# Patient Record
Sex: Male | Born: 1955
Health system: Southern US, Community
[De-identification: ages and names within clinical notes are randomized; demographics above are authoritative.]

## PROBLEM LIST (undated history)

## (undated) DIAGNOSIS — I10 Essential (primary) hypertension: Secondary | ICD-10-CM

## (undated) HISTORY — PX: TONSILLECTOMY: SUR1361

## (undated) HISTORY — PX: HERNIA REPAIR: SHX51

---

## 2018-06-09 DIAGNOSIS — C801 Malignant (primary) neoplasm, unspecified: Secondary | ICD-10-CM

## 2018-06-09 HISTORY — PX: OTHER SURGICAL HISTORY: SHX169

## 2018-06-09 HISTORY — DX: Malignant (primary) neoplasm, unspecified: C80.1

## 2018-09-16 DIAGNOSIS — D0339 Melanoma in situ of other parts of face: Secondary | ICD-10-CM | POA: Diagnosis not present

## 2018-11-05 DIAGNOSIS — E7800 Pure hypercholesterolemia, unspecified: Secondary | ICD-10-CM | POA: Diagnosis not present

## 2018-11-05 DIAGNOSIS — I10 Essential (primary) hypertension: Secondary | ICD-10-CM | POA: Diagnosis not present

## 2018-11-09 DIAGNOSIS — Z1331 Encounter for screening for depression: Secondary | ICD-10-CM | POA: Diagnosis not present

## 2018-11-09 DIAGNOSIS — N529 Male erectile dysfunction, unspecified: Secondary | ICD-10-CM | POA: Diagnosis not present

## 2018-11-09 DIAGNOSIS — G4700 Insomnia, unspecified: Secondary | ICD-10-CM | POA: Diagnosis not present

## 2018-11-09 DIAGNOSIS — K219 Gastro-esophageal reflux disease without esophagitis: Secondary | ICD-10-CM | POA: Diagnosis not present

## 2018-11-09 DIAGNOSIS — I10 Essential (primary) hypertension: Secondary | ICD-10-CM | POA: Diagnosis not present

## 2019-03-08 DIAGNOSIS — Z23 Encounter for immunization: Secondary | ICD-10-CM | POA: Diagnosis not present

## 2019-03-08 DIAGNOSIS — Z6833 Body mass index (BMI) 33.0-33.9, adult: Secondary | ICD-10-CM | POA: Diagnosis not present

## 2019-03-08 DIAGNOSIS — M25551 Pain in right hip: Secondary | ICD-10-CM | POA: Diagnosis not present

## 2019-03-09 DIAGNOSIS — M25551 Pain in right hip: Secondary | ICD-10-CM | POA: Diagnosis not present

## 2019-03-09 DIAGNOSIS — M1611 Unilateral primary osteoarthritis, right hip: Secondary | ICD-10-CM | POA: Diagnosis not present

## 2019-04-05 DIAGNOSIS — M1611 Unilateral primary osteoarthritis, right hip: Secondary | ICD-10-CM | POA: Diagnosis not present

## 2019-04-15 DIAGNOSIS — M1611 Unilateral primary osteoarthritis, right hip: Secondary | ICD-10-CM | POA: Diagnosis not present

## 2019-04-15 DIAGNOSIS — M1610 Unilateral primary osteoarthritis, unspecified hip: Secondary | ICD-10-CM | POA: Diagnosis not present

## 2019-05-11 DIAGNOSIS — K219 Gastro-esophageal reflux disease without esophagitis: Secondary | ICD-10-CM | POA: Diagnosis not present

## 2019-05-11 DIAGNOSIS — Z125 Encounter for screening for malignant neoplasm of prostate: Secondary | ICD-10-CM | POA: Diagnosis not present

## 2019-05-11 DIAGNOSIS — I10 Essential (primary) hypertension: Secondary | ICD-10-CM | POA: Diagnosis not present

## 2019-05-11 DIAGNOSIS — E7800 Pure hypercholesterolemia, unspecified: Secondary | ICD-10-CM | POA: Diagnosis not present

## 2019-05-13 DIAGNOSIS — M25551 Pain in right hip: Secondary | ICD-10-CM | POA: Diagnosis not present

## 2019-05-13 DIAGNOSIS — K219 Gastro-esophageal reflux disease without esophagitis: Secondary | ICD-10-CM | POA: Diagnosis not present

## 2019-05-13 DIAGNOSIS — I10 Essential (primary) hypertension: Secondary | ICD-10-CM | POA: Diagnosis not present

## 2019-05-13 DIAGNOSIS — G4700 Insomnia, unspecified: Secondary | ICD-10-CM | POA: Diagnosis not present

## 2019-06-22 DIAGNOSIS — M1611 Unilateral primary osteoarthritis, right hip: Secondary | ICD-10-CM | POA: Diagnosis not present

## 2019-06-22 DIAGNOSIS — M25551 Pain in right hip: Secondary | ICD-10-CM | POA: Diagnosis not present

## 2019-06-22 DIAGNOSIS — Z6833 Body mass index (BMI) 33.0-33.9, adult: Secondary | ICD-10-CM | POA: Diagnosis not present

## 2019-07-01 ENCOUNTER — Encounter (HOSPITAL_COMMUNITY): Payer: Self-pay | Admitting: Orthopedic Surgery

## 2019-07-01 DIAGNOSIS — M25551 Pain in right hip: Secondary | ICD-10-CM | POA: Diagnosis not present

## 2019-07-01 DIAGNOSIS — S72021A Displaced fracture of epiphysis (separation) (upper) of right femur, initial encounter for closed fracture: Secondary | ICD-10-CM | POA: Diagnosis not present

## 2019-07-01 NOTE — H&P (Signed)
TOTAL HIP ADMISSION H&P  Patient is admitted for right total hip arthroplasty.  Subjective:  Chief Complaint: Right displaced femoral neck fracture  HPI: John Bartlett, 64 y.o. male, has a history of pain and functional disability in right hip due to known bone-on-bone osteoarthritis. Has failed conservative measures in the past including activity modification and intra-articular cortisone injection. Patient presented to the office on 07/01/2019, stating that he had a significant increase in pain without known injury to the right hip. Described the pain as "unbearable" and was requiring a walker for ambulation. X-rays obtained in the office showed end-stage severe osteoarthritis of the right hip with erosion of the femoral head, as well as a displaced right femoral neck fracture.  There are no problems to display for this patient.  History reviewed. No pertinent past medical history.  History reviewed. No pertinent surgical history.  No current facility-administered medications for this encounter.   No current outpatient medications on file.   Not on File  Social History   Tobacco Use  . Smoking status: Not on file  Substance Use Topics  . Alcohol use: Not on file    History reviewed. No pertinent family history.   Review of Systems  Constitutional: Negative for chills and fever.  HENT: Negative for congestion, sore throat and tinnitus.   Eyes: Negative for photophobia and pain.  Respiratory: Negative for cough, shortness of breath and wheezing.   Cardiovascular: Negative for chest pain and palpitations.  Gastrointestinal: Negative for nausea and vomiting.  Genitourinary: Negative for dysuria, frequency and urgency.  Neurological: Negative for dizziness, weakness and headaches.    Objective:  Physical Exam  Well nourished and well developed.  General: Alert and oriented x3, cooperative and pleasant, no acute distress.  Head: normocephalic, atraumatic, neck supple.  Eyes:  EOMI.  Respiratory: breath sounds clear in all fields, no wheezing, rales, or rhonchi. Cardiovascular: Regular rate and rhythm, no murmurs, gallops or rubs.  Abdomen: non-tender to palpation and soft, normoactive bowel sounds. Musculoskeletal:  Right Hip Exam: Pain with any attempted range of motion of the right hip. Right leg shortened in comparison with the left.   Calves soft and nontender. Motor function intact in LE. Strength 5/5 LE bilaterally. Neuro: Distal pulses 2+. Sensation to light touch intact in LE.  Vital signs in last 24 hours: Blood pressure: 130/82 mmHg  Labs:   Imaging Review Plain radiographs demonstrate severe degenerative joint disease of the right hip(s) with a displaced femoral neck fracture with shortening of the right lower extremity. The bone quality appears to be adequate for age and reported activity level.  Assessment/Plan:  End stage arthritis, right hip(s) with displaced right femoral neck fracture  The patient history, physical examination, clinical judgement of the provider and imaging studies are consistent with end stage degenerative joint disease of the right hip(s) and total hip arthroplasty is deemed medically necessary. The treatment options including medical management, injection therapy, arthroscopy and arthroplasty were discussed at length. The risks and benefits of total hip arthroplasty were presented and reviewed. The risks due to aseptic loosening, infection, stiffness, dislocation/subluxation,  thromboembolic complications and other imponderables were discussed.  The patient acknowledged the explanation, agreed to proceed with the plan and consent was signed. Patient is being admitted for inpatient treatment for surgery, pain control, PT, OT, prophylactic antibiotics, VTE prophylaxis, progressive ambulation and ADL's and discharge planning.The patient is planning to be discharged home.   Anticipated LOS equal to or greater than 2 midnights  due to -  Age 36 and older with one or more of the following:  - Obesity  - Expected need for hospital services (PT, OT, Nursing) required for safe  discharge  - Anticipated need for postoperative skilled nursing care or inpatient rehab  - Active co-morbidities: None OR   - Unanticipated findings during/Post Surgery: None  - Patient is a high risk of re-admission due to: None  Therapy Plans: HEP Disposition: Home with wife Planned DVT Prophylaxis: Aspirin 325 mg BID DME needed: None PCP: Daiva Eves, MD TXA: IV Allergies: PCN (rash) Anesthesia Concerns: None BMI: 34.4  - Patient was instructed on what medications to stop prior to surgery. - Follow-up visit in 2 weeks with Dr. Wynelle Link - Begin physical therapy following surgery - Pre-operative lab work as pre-surgical testing - Prescriptions will be provided in hospital at time of discharge  Theresa Duty, PA-C Orthopedic Surgery [facility] [place] [facility]

## 2019-07-02 ENCOUNTER — Other Ambulatory Visit (HOSPITAL_COMMUNITY)
Admission: RE | Admit: 2019-07-02 | Discharge: 2019-07-02 | Disposition: A | Discharge status: Home / Self Care | Payer: BC Managed Care – PPO | Source: Ambulatory Visit | Attending: Orthopedic Surgery | Admitting: Orthopedic Surgery

## 2019-07-02 DIAGNOSIS — Z01812 Encounter for preprocedural laboratory examination: Secondary | ICD-10-CM | POA: Insufficient documentation

## 2019-07-02 DIAGNOSIS — Z20822 Contact with and (suspected) exposure to covid-19: Secondary | ICD-10-CM | POA: Diagnosis not present

## 2019-07-02 LAB — SARS CORONAVIRUS 2 (TAT 6-24 HRS): SARS Coronavirus 2: NEGATIVE

## 2019-07-04 NOTE — Patient Instructions (Signed)
DUE TO COVID-19 ONLY ONE VISITOR IS ALLOWED TO COME WITH YOU AND STAY IN THE WAITING ROOM ONLY DURING PRE OP AND PROCEDURE DAY OF SURGERY. THE 1 VISITOR MAY VISIT WITH YOU AFTER SURGERY IN YOUR PRIVATE ROOM DURING VISITING HOURS ONLY!  YOU NEED TO HAVE A COVID 19 TEST ON: 07/02/19, THIS TEST MUST BE DONE BEFORE SURGERY, COME  [address], [place] , [id number].  ([place]) ONCE YOUR COVID TEST IS COMPLETED, PLEASE BEGIN THE QUARANTINE INSTRUCTIONS AS OUTLINED IN YOUR HANDOUT.                John Bartlett      Your procedure is scheduled on: 07/06/2019   Report to [facility] Main  Entrance   Report to admitting at: 7:00 AM     Call this number if you have problems the morning of surgery [phone]    Remember:   [place], NO [place].     Take these medicines the morning of surgery with A SIP OF WATER: omeprazole,rosuvastatin                                 You may not have any metal on your body including hair pins and              piercings  Do not wear jewelry,lotions, powders or perfumes, deodorant             Men may shave face and neck.     Do not bring valuables to the hospital. [place].  Contacts, dentures or bridgework may not be worn into surgery.  Leave suitcase in the car. After surgery it may be brought to your room.     Patients discharged the day of surgery will not be allowed to drive home. IF YOU ARE HAVING SURGERY AND GOING HOME THE SAME DAY, YOU MUST HAVE AN ADULT TO DRIVE YOU HOME AND  BE WITH YOU FOR 24 HOURS. YOU MAY GO HOME BY TAXI OR UBER OR ORTHERWISE, BUT AN ADULT MUST ACCOMPANY YOU HOME AND STAY WITH YOU FOR 24 HOURS.  Name and phone number of your driver:  Special Instructions: N/A              Please read over the following fact sheets you were  given: _____________________________________________________________________             [facility] - Preparing for Surgery Before surgery, you can play an important role.  Because skin is not sterile, your skin needs to be as free of germs as possible.  You can reduce the number of germs on your skin by washing with CHG (chlorahexidine gluconate) soap before surgery.  CHG is an antiseptic cleaner which kills germs and bonds with the skin to continue killing germs even after washing. Please DO NOT use if you have an allergy to CHG or antibacterial soaps.  If your skin becomes reddened/irritated stop using the CHG and inform your nurse when you arrive at Short Stay. Do not shave (including legs and underarms) for at least 48 hours prior to the first CHG shower.  You may shave your face/neck. Please follow these instructions carefully:  1.  Shower with CHG Soap the night  before surgery and the  morning of Surgery.  2.  If you choose to wash your hair, wash your hair first as usual with your  normal  shampoo.  3.  After you shampoo, rinse your hair and body thoroughly to remove the  shampoo.                           4.  Use CHG as you would any other liquid soap.  You can apply chg directly  to the skin and wash                       Gently with a scrungie or clean washcloth.  5.  Apply the CHG Soap to your body ONLY FROM THE NECK DOWN.   Do not use on face/ open                           Wound or open sores. Avoid contact with eyes, ears mouth and genitals (private parts).                       Wash face,  Genitals (private parts) with your normal soap.             6.  Wash thoroughly, paying special attention to the area where your surgery  will be performed.  7.  Thoroughly rinse your body with warm water from the neck down.  8.  DO NOT shower/wash with your normal soap after using and rinsing off  the CHG Soap.                9.  Pat yourself dry with a clean towel.            10.  Wear  clean pajamas.            11.  Place clean sheets on your bed the night of your first shower and do not  sleep with pets. Day of Surgery : Do not apply any lotions/deodorants the morning of surgery.  Please wear clean clothes to the hospital/surgery center.  FAILURE TO FOLLOW THESE INSTRUCTIONS MAY RESULT IN THE CANCELLATION OF YOUR SURGERY PATIENT SIGNATURE_________________________________  NURSE SIGNATURE__________________________________  ________________________________________________________________________  NO SOLID FOOD AFTER MIDNIGHT THE NIGHT PRIOR TO SURGERY. NOTHING BY MOUTH EXCEPT CLEAR LIQUIDS UNTIL: 6:20 am . PLEASE FINISH ENSURE DRINK PER SURGEON ORDER  WHICH NEEDS TO BE COMPLETED AT: 6:20 am.   CLEAR LIQUID DIET   Foods Allowed                                                                     Foods Excluded  Coffee and tea, regular and decaf                             liquids that you cannot  Plain Jell-O any favor except red or purple  see through such as: Fruit ices (not with fruit pulp)                                     milk, soups, orange juice  Iced Popsicles                                    All solid food Carbonated beverages, regular and diet                                    Cranberry, grape and apple juices Sports drinks like Gatorade Lightly seasoned clear broth or consume(fat free) Sugar, honey syrup  Sample Menu Breakfast                                Lunch                                     Supper Cranberry juice                    Beef broth                            Chicken broth Jell-O                                     Grape juice                           Apple juice Coffee or tea                        Jell-O                                      Popsicle                                                Coffee or tea                        Coffee or  tea  _____________________________________________________________________    Incentive Spirometer  An incentive spirometer is a tool that can help keep your lungs clear and active. This tool measures how well you are filling your lungs with each breath. Taking long deep breaths may help reverse or decrease the chance of developing breathing (pulmonary) problems (especially infection) following:  A long period of time when you are unable to move or be active. BEFORE THE PROCEDURE   If the spirometer includes an indicator to show your best effort, your nurse or respiratory therapist will set it to a desired goal.  If possible, sit up straight or lean slightly forward. Try not to slouch.  Hold the incentive  spirometer in an upright position. INSTRUCTIONS FOR USE  1. Sit on the edge of your bed if possible, or sit up as far as you can in bed or on a chair. 2. Hold the incentive spirometer in an upright position. 3. Breathe out normally. 4. Place the mouthpiece in your mouth and seal your lips tightly around it. 5. Breathe in slowly and as deeply as possible, raising the piston or the ball toward the top of the column. 6. Hold your breath for 3-5 seconds or for as long as possible. Allow the piston or ball to fall to the bottom of the column. 7. Remove the mouthpiece from your mouth and breathe out normally. 8. Rest for a few seconds and repeat Steps 1 through 7 at least 10 times every 1-2 hours when you are awake. Take your time and take a few normal breaths between deep breaths. 9. The spirometer may include an indicator to show your best effort. Use the indicator as a goal to work toward during each repetition. 10. After each set of 10 deep breaths, practice coughing to be sure your lungs are clear. If you have an incision (the cut made at the time of surgery), support your incision when coughing by placing a pillow or rolled up towels firmly against it. Once you are able to get out of  bed, walk around indoors and cough well. You may stop using the incentive spirometer when instructed by your caregiver.  RISKS AND COMPLICATIONS  Take your time so you do not get dizzy or light-headed.  If you are in pain, you may need to take or ask for pain medication before doing incentive spirometry. It is harder to take a deep breath if you are having pain. AFTER USE  Rest and breathe slowly and easily.  It can be helpful to keep track of a log of your progress. Your caregiver can provide you with a simple table to help with this. If you are using the spirometer at home, follow these instructions: [place] IF:   You are having difficultly using the spirometer.  You have trouble using the spirometer as often as instructed.  Your pain medication is not giving enough relief while using the spirometer.  You develop fever of 100.5 F (38.1 C) or higher. SEEK IMMEDIATE MEDICAL CARE IF:   You cough up bloody sputum that had not been present before.  You develop fever of 102 F (38.9 C) or greater.  You develop worsening pain at or near the incision site. MAKE SURE YOU:   Understand these instructions.  Will watch your condition.  Will get help right away if you are not doing well or get worse. Document Released: 10/06/2006 Document Revised: 08/18/2011 Document Reviewed: 12/07/2006 [facility] Patient Information 2014 [place], [place].   ________________________________________________________________________

## 2019-07-05 ENCOUNTER — Encounter (HOSPITAL_COMMUNITY)
Admission: RE | Admit: 2019-07-05 | Discharge: 2019-07-05 | Disposition: A | Discharge status: Home / Self Care | Payer: BC Managed Care – PPO | Source: Ambulatory Visit | Attending: Orthopedic Surgery | Admitting: Orthopedic Surgery

## 2019-07-05 ENCOUNTER — Encounter (HOSPITAL_COMMUNITY): Payer: Self-pay

## 2019-07-05 ENCOUNTER — Other Ambulatory Visit: Payer: Self-pay

## 2019-07-05 DIAGNOSIS — I10 Essential (primary) hypertension: Secondary | ICD-10-CM | POA: Diagnosis not present

## 2019-07-05 DIAGNOSIS — S72001A Fracture of unspecified part of neck of right femur, initial encounter for closed fracture: Secondary | ICD-10-CM | POA: Diagnosis not present

## 2019-07-05 DIAGNOSIS — M1611 Unilateral primary osteoarthritis, right hip: Secondary | ICD-10-CM | POA: Diagnosis not present

## 2019-07-05 DIAGNOSIS — M25751 Osteophyte, right hip: Secondary | ICD-10-CM | POA: Diagnosis not present

## 2019-07-05 HISTORY — DX: Essential (primary) hypertension: I10

## 2019-07-05 LAB — COMPREHENSIVE METABOLIC PANEL
ALT: 33 U/L (ref 0–44)
AST: 25 U/L (ref 15–41)
Albumin: 4.2 g/dL (ref 3.5–5.0)
Alkaline Phosphatase: 91 U/L (ref 38–126)
Anion gap: 8 (ref 5–15)
BUN: 15 mg/dL (ref 8–23)
CO2: 29 mmol/L (ref 22–32)
Calcium: 9.4 mg/dL (ref 8.9–10.3)
Chloride: 99 mmol/L (ref 98–111)
Creatinine, Ser: 0.69 mg/dL (ref 0.61–1.24)
GFR calc Af Amer: >60 mL/min (ref >60)
GFR calc non Af Amer: >60 mL/min (ref >60)
Glucose, Bld: 113 mg/dL — ABNORMAL HIGH (ref 70–99)
Potassium: 4.3 mmol/L (ref 3.5–5.1)
Sodium: 136 mmol/L (ref 135–145)
Total Bilirubin: 0.8 mg/dL (ref 0.3–1.2)
Total Protein: 7.1 g/dL (ref 6.5–8.1)

## 2019-07-05 LAB — CBC
HCT: 47.2 % (ref 39.0–52.0)
Hemoglobin: 15.3 g/dL (ref 13.0–17.0)
MCH: 30.5 pg (ref 26.0–34.0)
MCHC: 32.4 g/dL (ref 30.0–36.0)
MCV: 94 fL (ref 80.0–100.0)
Platelets: 225 10*3/uL (ref 150–400)
RBC: 5.02 MIL/uL (ref 4.22–5.81)
RDW: 12 % (ref 11.5–15.5)
WBC: 5.5 10*3/uL (ref 4.0–10.5)
nRBC: 0 % (ref 0.0–0.2)

## 2019-07-05 LAB — SURGICAL PCR SCREEN
MRSA, PCR: NEGATIVE
Staphylococcus aureus: POSITIVE — AB

## 2019-07-05 LAB — ABO/RH: ABO/RH(D): A NEG

## 2019-07-05 LAB — PROTIME-INR
INR: 1.2 (ref 0.8–1.2)
Prothrombin Time: 14.7 seconds (ref 11.4–15.2)

## 2019-07-05 LAB — APTT: aPTT: 32 seconds (ref 24–36)

## 2019-07-06 ENCOUNTER — Inpatient Hospital Stay (HOSPITAL_COMMUNITY)
Admission: RE | Admit: 2019-07-06 | Discharge: 2019-07-07 | DRG: 522 | Disposition: A | Discharge status: Home / Self Care | Payer: BC Managed Care – PPO | Attending: Orthopedic Surgery | Admitting: Orthopedic Surgery

## 2019-07-06 ENCOUNTER — Encounter (HOSPITAL_COMMUNITY)
Admission: RE | Disposition: A | Discharge status: Home / Self Care | Payer: Self-pay | Source: Home / Self Care | Attending: Orthopedic Surgery

## 2019-07-06 ENCOUNTER — Inpatient Hospital Stay (HOSPITAL_COMMUNITY): Payer: BC Managed Care – PPO

## 2019-07-06 ENCOUNTER — Observation Stay (HOSPITAL_COMMUNITY): Payer: BC Managed Care – PPO

## 2019-07-06 ENCOUNTER — Encounter (HOSPITAL_COMMUNITY): Payer: Self-pay | Admitting: Orthopedic Surgery

## 2019-07-06 ENCOUNTER — Inpatient Hospital Stay (HOSPITAL_COMMUNITY): Payer: BC Managed Care – PPO | Admitting: Anesthesiology

## 2019-07-06 ENCOUNTER — Inpatient Hospital Stay (HOSPITAL_COMMUNITY): Payer: BC Managed Care – PPO | Admitting: Physician Assistant

## 2019-07-06 DIAGNOSIS — X58XXXA Exposure to other specified factors, initial encounter: Secondary | ICD-10-CM | POA: Diagnosis present

## 2019-07-06 DIAGNOSIS — S72021A Displaced fracture of epiphysis (separation) (upper) of right femur, initial encounter for closed fracture: Secondary | ICD-10-CM | POA: Diagnosis not present

## 2019-07-06 DIAGNOSIS — M1611 Unilateral primary osteoarthritis, right hip: Secondary | ICD-10-CM | POA: Diagnosis present

## 2019-07-06 DIAGNOSIS — M25751 Osteophyte, right hip: Secondary | ICD-10-CM | POA: Diagnosis present

## 2019-07-06 DIAGNOSIS — Z85828 Personal history of other malignant neoplasm of skin: Secondary | ICD-10-CM | POA: Diagnosis not present

## 2019-07-06 DIAGNOSIS — I10 Essential (primary) hypertension: Secondary | ICD-10-CM | POA: Diagnosis not present

## 2019-07-06 DIAGNOSIS — Z471 Aftercare following joint replacement surgery: Secondary | ICD-10-CM | POA: Diagnosis not present

## 2019-07-06 DIAGNOSIS — Z96649 Presence of unspecified artificial hip joint: Secondary | ICD-10-CM

## 2019-07-06 DIAGNOSIS — Z96641 Presence of right artificial hip joint: Secondary | ICD-10-CM | POA: Diagnosis not present

## 2019-07-06 DIAGNOSIS — S72001A Fracture of unspecified part of neck of right femur, initial encounter for closed fracture: Principal | ICD-10-CM | POA: Diagnosis present

## 2019-07-06 DIAGNOSIS — Z419 Encounter for procedure for purposes other than remedying health state, unspecified: Secondary | ICD-10-CM

## 2019-07-06 DIAGNOSIS — Z880 Allergy status to penicillin: Secondary | ICD-10-CM | POA: Diagnosis not present

## 2019-07-06 HISTORY — PX: TOTAL HIP ARTHROPLASTY: SHX124

## 2019-07-06 LAB — TYPE AND SCREEN
ABO/RH(D): A NEG
Antibody Screen: NEGATIVE

## 2019-07-06 SURGERY — ARTHROPLASTY, HIP, TOTAL, ANTERIOR APPROACH
Anesthesia: Monitor Anesthesia Care | Site: Hip | Laterality: Right

## 2019-07-06 MED ORDER — TRAMADOL HCL 50 MG PO TABS: 50.0000 mg | ORAL_TABLET | Freq: Four times a day (QID) | ORAL | Status: DC | PRN

## 2019-07-06 MED ORDER — PHENYLEPHRINE 40 MCG/ML (10ML) SYRINGE FOR IV PUSH (FOR BLOOD PRESSURE SUPPORT)
PREFILLED_SYRINGE | INTRAVENOUS | Status: DC | PRN
  Administered 2019-07-06 (×3): 80 ug via INTRAVENOUS

## 2019-07-06 MED ORDER — ONDANSETRON HCL 4 MG/2ML IJ SOLN: 4.0000 mg | Freq: Four times a day (QID) | INTRAMUSCULAR | Status: DC | PRN

## 2019-07-06 MED ORDER — BUPIVACAINE IN DEXTROSE 0.75-8.25 % IT SOLN
INTRATHECAL | Status: DC | PRN
  Administered 2019-07-06: 1.8 mL via INTRATHECAL

## 2019-07-06 MED ORDER — LIDOCAINE 2% (20 MG/ML) 5 ML SYRINGE
INTRAMUSCULAR | Status: DC | PRN
  Administered 2019-07-06: 60 mg via INTRAVENOUS

## 2019-07-06 MED ORDER — BISACODYL 10 MG RE SUPP: 10.0000 mg | Freq: Every day | RECTAL | Status: DC | PRN

## 2019-07-06 MED ORDER — MAGNESIUM CITRATE PO SOLN: 1.0000 | Freq: Once | ORAL | Status: DC | PRN

## 2019-07-06 MED ORDER — PROPOFOL 500 MG/50ML IV EMUL
INTRAVENOUS | Status: DC | PRN
  Administered 2019-07-06: 70 mg via INTRAVENOUS

## 2019-07-06 MED ORDER — EPHEDRINE SULFATE-NACL 50-0.9 MG/10ML-% IV SOSY
PREFILLED_SYRINGE | INTRAVENOUS | Status: DC | PRN
  Administered 2019-07-06: 10 mg via INTRAVENOUS

## 2019-07-06 MED ORDER — FENTANYL CITRATE (PF) 100 MCG/2ML IJ SOLN: 25.0000 ug | INTRAMUSCULAR | Status: DC | PRN

## 2019-07-06 MED ORDER — MIDAZOLAM HCL 2 MG/2ML IJ SOLN
INTRAMUSCULAR | Status: DC | PRN
  Administered 2019-07-06: 2 mg via INTRAVENOUS

## 2019-07-06 MED ORDER — PHENOL 1.4 % MT LIQD
1.0000 | OROMUCOSAL | Status: DC | PRN
  Filled 2019-07-06: qty 177

## 2019-07-06 MED ORDER — PROPOFOL 500 MG/50ML IV EMUL
INTRAVENOUS | Status: DC | PRN
  Administered 2019-07-06: 125 ug/kg/min via INTRAVENOUS

## 2019-07-06 MED ORDER — ACETAMINOPHEN 10 MG/ML IV SOLN
1000.0000 mg | Freq: Four times a day (QID) | INTRAVENOUS | Status: DC
  Administered 2019-07-06: 1000 mg via INTRAVENOUS

## 2019-07-06 MED ORDER — ONDANSETRON HCL 4 MG PO TABS: 4.0000 mg | ORAL_TABLET | Freq: Four times a day (QID) | ORAL | Status: DC | PRN

## 2019-07-06 MED ORDER — LACTATED RINGERS IV SOLN: INTRAVENOUS | Status: DC

## 2019-07-06 MED ORDER — WATER FOR IRRIGATION, STERILE IR SOLN
Status: DC | PRN
  Administered 2019-07-06: 2000 mL

## 2019-07-06 MED ORDER — HYDROCODONE-ACETAMINOPHEN 5-325 MG PO TABS
1.0000 | ORAL_TABLET | ORAL | Status: DC | PRN
  Administered 2019-07-06 – 2019-07-07 (×5): 2 via ORAL
  Filled 2019-07-06 (×5): qty 2

## 2019-07-06 MED ORDER — FENTANYL CITRATE (PF) 100 MCG/2ML IJ SOLN
INTRAMUSCULAR | Status: DC | PRN
  Administered 2019-07-06: 50 ug via INTRAVENOUS

## 2019-07-06 MED ORDER — CEFAZOLIN SODIUM-DEXTROSE 2-4 GM/100ML-% IV SOLN
INTRAVENOUS | Status: AC
  Filled 2019-07-06: qty 100

## 2019-07-06 MED ORDER — METOCLOPRAMIDE HCL 5 MG/ML IJ SOLN: 5.0000 mg | Freq: Three times a day (TID) | INTRAMUSCULAR | Status: DC | PRN

## 2019-07-06 MED ORDER — CHLORHEXIDINE GLUCONATE 4 % EX LIQD: 60.0000 mL | Freq: Once | CUTANEOUS | Status: DC

## 2019-07-06 MED ORDER — CEFAZOLIN SODIUM-DEXTROSE 2-4 GM/100ML-% IV SOLN
2.0000 g | INTRAVENOUS | Status: AC
  Administered 2019-07-06: 10:00:00 2 g via INTRAVENOUS

## 2019-07-06 MED ORDER — PHENYLEPHRINE HCL-NACL 10-0.9 MG/250ML-% IV SOLN
INTRAVENOUS | Status: DC | PRN
  Administered 2019-07-06: 25 ug/min via INTRAVENOUS

## 2019-07-06 MED ORDER — SODIUM CHLORIDE 0.9 % IV SOLN: INTRAVENOUS | Status: DC

## 2019-07-06 MED ORDER — MORPHINE SULFATE (PF) 2 MG/ML IV SOLN: 0.5000 mg | INTRAVENOUS | Status: DC | PRN

## 2019-07-06 MED ORDER — POVIDONE-IODINE 10 % EX SWAB
2.0000 "application " | Freq: Once | CUTANEOUS | Status: AC
  Administered 2019-07-06: 2 via TOPICAL

## 2019-07-06 MED ORDER — ACETAMINOPHEN 325 MG PO TABS: 325.0000 mg | ORAL_TABLET | Freq: Four times a day (QID) | ORAL | Status: DC | PRN

## 2019-07-06 MED ORDER — METOCLOPRAMIDE HCL 5 MG PO TABS: 5.0000 mg | ORAL_TABLET | Freq: Three times a day (TID) | ORAL | Status: DC | PRN

## 2019-07-06 MED ORDER — ASPIRIN EC 325 MG PO TBEC
325.0000 mg | DELAYED_RELEASE_TABLET | Freq: Two times a day (BID) | ORAL | Status: DC
  Administered 2019-07-07: 325 mg via ORAL
  Filled 2019-07-06: qty 1

## 2019-07-06 MED ORDER — DEXAMETHASONE SODIUM PHOSPHATE 10 MG/ML IJ SOLN
8.0000 mg | Freq: Once | INTRAMUSCULAR | Status: AC
  Administered 2019-07-06: 10:00:00 10 mg via INTRAVENOUS

## 2019-07-06 MED ORDER — METHOCARBAMOL 500 MG IVPB - SIMPLE MED
500.0000 mg | Freq: Four times a day (QID) | INTRAVENOUS | Status: DC | PRN
  Filled 2019-07-06: qty 50

## 2019-07-06 MED ORDER — 0.9 % SODIUM CHLORIDE (POUR BTL) OPTIME
TOPICAL | Status: DC | PRN
  Administered 2019-07-06: 1000 mL

## 2019-07-06 MED ORDER — ACETAMINOPHEN 10 MG/ML IV SOLN
INTRAVENOUS | Status: AC
  Filled 2019-07-06: qty 100

## 2019-07-06 MED ORDER — OXYCODONE HCL 5 MG/5ML PO SOLN: 5.0000 mg | Freq: Once | ORAL | Status: DC | PRN

## 2019-07-06 MED ORDER — DEXAMETHASONE SODIUM PHOSPHATE 10 MG/ML IJ SOLN
10.0000 mg | Freq: Once | INTRAMUSCULAR | Status: AC
  Administered 2019-07-07: 10 mg via INTRAVENOUS
  Filled 2019-07-06: qty 1

## 2019-07-06 MED ORDER — TRANEXAMIC ACID-NACL 1000-0.7 MG/100ML-% IV SOLN
1000.0000 mg | INTRAVENOUS | Status: AC
  Administered 2019-07-06: 1000 mg via INTRAVENOUS

## 2019-07-06 MED ORDER — DOCUSATE SODIUM 100 MG PO CAPS
100.0000 mg | ORAL_CAPSULE | Freq: Two times a day (BID) | ORAL | Status: DC
  Administered 2019-07-06 – 2019-07-07 (×2): 100 mg via ORAL
  Filled 2019-07-06 (×2): qty 1

## 2019-07-06 MED ORDER — MIDAZOLAM HCL 2 MG/2ML IJ SOLN
INTRAMUSCULAR | Status: AC
  Filled 2019-07-06: qty 2

## 2019-07-06 MED ORDER — BUPIVACAINE HCL (PF) 0.25 % IJ SOLN
INTRAMUSCULAR | Status: AC
  Filled 2019-07-06: qty 30

## 2019-07-06 MED ORDER — POLYETHYLENE GLYCOL 3350 17 G PO PACK: 17.0000 g | PACK | Freq: Every day | ORAL | Status: DC | PRN

## 2019-07-06 MED ORDER — MENTHOL 3 MG MT LOZG: 1.0000 | LOZENGE | OROMUCOSAL | Status: DC | PRN

## 2019-07-06 MED ORDER — PROPOFOL 10 MG/ML IV BOLUS
INTRAVENOUS | Status: AC
  Filled 2019-07-06: qty 20

## 2019-07-06 MED ORDER — FENTANYL CITRATE (PF) 100 MCG/2ML IJ SOLN
INTRAMUSCULAR | Status: AC
  Filled 2019-07-06: qty 2

## 2019-07-06 MED ORDER — ONDANSETRON HCL 4 MG/2ML IJ SOLN
INTRAMUSCULAR | Status: DC | PRN
  Administered 2019-07-06: 4 mg via INTRAVENOUS

## 2019-07-06 MED ORDER — BUPIVACAINE HCL 0.25 % IJ SOLN
INTRAMUSCULAR | Status: DC | PRN
  Administered 2019-07-06: 30 mL

## 2019-07-06 MED ORDER — CEFAZOLIN SODIUM-DEXTROSE 2-4 GM/100ML-% IV SOLN
2.0000 g | Freq: Four times a day (QID) | INTRAVENOUS | Status: AC
  Administered 2019-07-06 (×2): 2 g via INTRAVENOUS
  Filled 2019-07-06 (×2): qty 100

## 2019-07-06 MED ORDER — METHOCARBAMOL 500 MG PO TABS
500.0000 mg | ORAL_TABLET | Freq: Four times a day (QID) | ORAL | Status: DC | PRN
  Administered 2019-07-07 (×2): 500 mg via ORAL
  Filled 2019-07-06 (×2): qty 1

## 2019-07-06 MED ORDER — TRANEXAMIC ACID-NACL 1000-0.7 MG/100ML-% IV SOLN
INTRAVENOUS | Status: AC
  Filled 2019-07-06: qty 100

## 2019-07-06 MED ORDER — OXYCODONE HCL 5 MG PO TABS: 5.0000 mg | ORAL_TABLET | Freq: Once | ORAL | Status: DC | PRN

## 2019-07-06 MED ORDER — PANTOPRAZOLE SODIUM 40 MG PO TBEC
40.0000 mg | DELAYED_RELEASE_TABLET | Freq: Every day | ORAL | Status: DC
  Administered 2019-07-06 – 2019-07-07 (×2): 40 mg via ORAL
  Filled 2019-07-06 (×2): qty 1

## 2019-07-06 SURGICAL SUPPLY — 47 items
BAG DECANTER FOR FLEXI CONT (MISCELLANEOUS) IMPLANT
BAG ZIPLOCK 12X15 (MISCELLANEOUS) IMPLANT
BLADE SAG 18X100X1.27 (BLADE) ×3 IMPLANT
CLOSURE WOUND 1/2 X4 (GAUZE/BANDAGES/DRESSINGS) ×2
COVER PERINEAL POST (MISCELLANEOUS) ×3 IMPLANT
COVER SURGICAL LIGHT HANDLE (MISCELLANEOUS) ×3 IMPLANT
COVER WAND RF STERILE (DRAPES) IMPLANT
CUP ACET PINNACLE SECTR 56MM (Hips) ×1 IMPLANT
DECANTER SPIKE VIAL GLASS SM (MISCELLANEOUS) IMPLANT
DRAPE STERI IOBAN 125X83 (DRAPES) ×3 IMPLANT
DRAPE U-SHAPE 47X51 STRL (DRAPES) ×6 IMPLANT
DRSG ADAPTIC 3X8 NADH LF (GAUZE/BANDAGES/DRESSINGS) ×3 IMPLANT
DRSG MEPILEX BORDER 4X4 (GAUZE/BANDAGES/DRESSINGS) ×3 IMPLANT
DRSG MEPILEX BORDER 4X8 (GAUZE/BANDAGES/DRESSINGS) ×3 IMPLANT
DURAPREP 26ML APPLICATOR (WOUND CARE) ×3 IMPLANT
ELECT REM PT RETURN 15FT ADLT (MISCELLANEOUS) ×3 IMPLANT
EVACUATOR 1/8 PVC DRAIN (DRAIN) ×3 IMPLANT
GLOVE BIO SURGEON STRL SZ 6 (GLOVE) IMPLANT
GLOVE BIO SURGEON STRL SZ7 (GLOVE) IMPLANT
GLOVE BIO SURGEON STRL SZ8 (GLOVE) ×3 IMPLANT
GLOVE BIOGEL PI IND STRL 6.5 (GLOVE) IMPLANT
GLOVE BIOGEL PI IND STRL 7.0 (GLOVE) IMPLANT
GLOVE BIOGEL PI IND STRL 8 (GLOVE) ×1 IMPLANT
GLOVE BIOGEL PI INDICATOR 6.5 (GLOVE)
GLOVE BIOGEL PI INDICATOR 7.0 (GLOVE)
GLOVE BIOGEL PI INDICATOR 8 (GLOVE) ×2
GOWN STRL REUS W/TWL LRG LVL3 (GOWN DISPOSABLE) ×3 IMPLANT
GOWN STRL REUS W/TWL XL LVL3 (GOWN DISPOSABLE) IMPLANT
HEAD CERAMIC 36 PLUS5 (Hips) ×3 IMPLANT
HOLDER FOLEY CATH W/STRAP (MISCELLANEOUS) ×3 IMPLANT
KIT TURNOVER KIT A (KITS) IMPLANT
LINER MARATHON 4 NEUTRAL 36X56 (Hips) ×3 IMPLANT
MANIFOLD NEPTUNE II (INSTRUMENTS) ×3 IMPLANT
PACK ANTERIOR HIP CUSTOM (KITS) ×3 IMPLANT
PENCIL SMOKE EVACUATOR COATED (MISCELLANEOUS) ×3 IMPLANT
PINNACLE SECTOR CUP 56MM (Hips) ×3 IMPLANT
STEM FEM ACTIS HIGH SZ8 (Stem) ×3 IMPLANT
STRIP CLOSURE SKIN 1/2X4 (GAUZE/BANDAGES/DRESSINGS) ×4 IMPLANT
SUT ETHIBOND NAB CT1 #1 30IN (SUTURE) ×3 IMPLANT
SUT MNCRL AB 4-0 PS2 18 (SUTURE) ×3 IMPLANT
SUT STRATAFIX 0 PDS 27 VIOLET (SUTURE) ×3
SUT VIC AB 2-0 CT1 27 (SUTURE) ×4
SUT VIC AB 2-0 CT1 TAPERPNT 27 (SUTURE) ×2 IMPLANT
SUTURE STRATFX 0 PDS 27 VIOLET (SUTURE) ×1 IMPLANT
SYR 50ML LL SCALE MARK (SYRINGE) IMPLANT
TRAY FOLEY MTR SLVR 16FR STAT (SET/KITS/TRAYS/PACK) ×3 IMPLANT
YANKAUER SUCT BULB TIP 10FT TU (MISCELLANEOUS) ×3 IMPLANT

## 2019-07-06 NOTE — Plan of Care (Signed)

## 2019-07-06 NOTE — Discharge Instructions (Addendum)
Gaynelle Arabian, MD Total Joint Specialist [facility] [address], [address] [place], [place] [address] [phone]  ANTERIOR APPROACH TOTAL HIP REPLACEMENT POSTOPERATIVE DIRECTIONS     Hip Rehabilitation, Guidelines Following Surgery  The results of a hip operation are greatly improved after range of motion and muscle strengthening exercises. Follow all safety measures which are given to protect your hip. If any of these exercises cause increased pain or swelling in your joint, decrease the amount until you are comfortable again. Then slowly increase the exercises. Call your caregiver if you have problems or questions.   BLOOD CLOT PREVENTION . Take a 325 mg Aspirin two times a day for three weeks following surgery. Then resume one 81 mg Aspirin once a day. Dennis Bast may resume your vitamins/supplements upon discharge from the hospital. . Do not take any NSAIDs (Advil, Aleve, Ibuprofen, Meloxicam, etc.) until you have discontinued the 325 mg Aspirin.  HOME CARE INSTRUCTIONS  . Remove items at home which could result in a fall. This includes throw rugs or furniture in walking pathways.   ICE to the affected hip as frequently as 20-30 minutes an hour and then as needed for pain and swelling. Continue to use ice on the hip for pain and swelling from surgery. You may notice swelling that will progress down to the foot and ankle. This is normal after surgery. Elevate the leg when you are not up walking on it.    Continue to use the breathing machine which will help keep your temperature down.  It is common for your temperature to cycle up and down following surgery, especially at night when you are not up moving around and exerting yourself.  The breathing machine keeps your lungs expanded and your temperature down.  DIET You may resume your previous home diet once your are discharged from the hospital.  DRESSING / WOUND CARE / SHOWERING . You may remove the adhesive  bandage 2 days following surgery. Leave the tape strips across the incision in place until your first follow-up appointment. Cover the incision with a 4x4 gauze and paper tape. Change the dressing daily with new gauze and paper tape for 7-10 days following surgery. . You may begin showering 3 days following surgery, but do not submerge the incision under water. . Allow water and soap to run over the incision, pat dry, and apply a fresh gauze dressing.  ACTIVITY . For the first 3-5 days, it is important to rest and keep the operative leg elevated. You should, as a general rule, rest for 50 minutes and walk/stretch for 10 minutes per hour. After 5 days, you may slowly increase activity as tolerated.  [place] Kitchen Perform the exercises you were provided twice a day for about 15-20 minutes each session. Begin these 2 days following surgery. . Walk with your walker as instructed. Use the walker until you are comfortable transitioning to a cane. Walk with the cane in the opposite hand of the operative leg. You may discontinue the cane once you are comfortable and walking steadily. . Avoid periods of inactivity such as sitting longer than an hour when not asleep. This helps prevent blood clots.  . Do not drive a car for 6 weeks or until released by your surgeon.  . Do not drive while taking narcotics.  TED HOSE STOCKINGS Wear the elastic stockings on both legs for three weeks following surgery during the day. You may remove them at night while sleeping.  WEIGHT BEARING Weight bearing as  tolerated with assist device (walker, cane, etc) as directed, use it as long as suggested by your surgeon or therapist, typically at least 4-6 weeks.  POSTOPERATIVE CONSTIPATION PROTOCOL Constipation - defined medically as fewer than three stools per week and severe constipation as less than one stool per week.  One of the most common issues patients have following surgery is constipation.  Even if you have a regular bowel pattern  at home, your normal regimen is likely to be disrupted due to multiple reasons following surgery.  Combination of anesthesia, postoperative narcotics, change in appetite and fluid intake all can affect your bowels.  In order to avoid complications following surgery, here are some recommendations in order to help you during your recovery period.  . Colace (docusate) - Pick up an over-the-counter form of Colace or another stool softener and take twice a day as long as you are requiring postoperative pain medications.  Take with a full glass of water daily.  If you experience loose stools or diarrhea, hold the colace until you stool forms back up.  If your symptoms do not get better within 1 week or if they get worse, check with your doctor. . Dulcolax (bisacodyl) - Pick up over-the-counter and take as directed by the product packaging as needed to assist with the movement of your bowels.  Take with a full glass of water.  Use this product as needed if not relieved by Colace only.  . MiraLax (polyethylene glycol) - Pick up over-the-counter to have on hand.  MiraLax is a solution that will increase the amount of water in your bowels to assist with bowel movements.  Take as directed and can mix with a glass of water, juice, soda, coffee, or tea.  Take if you go more than two days without a movement.Do not use MiraLax more than once per day. Call your doctor if you are still constipated or irregular after using this medication for 7 days in a row.  If you continue to have problems with postoperative constipation, please contact the office for further assistance and recommendations.  If you experience "the worst abdominal pain ever" or develop nausea or vomiting, please contact the office immediatly for further recommendations for treatment.  ITCHING  If you experience itching with your medications, try taking only a single pain pill, or even half a pain pill at a time.  You can also use Benadryl over the counter  for itching or also to help with sleep.   MEDICATIONS See your medication summary on the "After Visit Summary" that the nursing staff will review with you prior to discharge.  You may have some home medications which will be placed on hold until you complete the course of blood thinner medication.  It is important for you to complete the blood thinner medication as prescribed by your surgeon.  Continue your approved medications as instructed at time of discharge.  PRECAUTIONS If you experience chest pain or shortness of breath - call 911 immediately for transfer to the hospital emergency department.  If you develop a fever greater that 101 F, purulent drainage from wound, increased redness or drainage from wound, foul odor from the wound/dressing, or calf pain - CONTACT YOUR SURGEON.  FOLLOW-UP APPOINTMENTS Make sure you keep all of your appointments after your operation with your surgeon and caregivers. You should call the office at the above phone number and make an appointment for approximately two weeks after the date of your surgery or on the date instructed by your surgeon outlined in the "After Visit Summary".  RANGE OF MOTION AND STRENGTHENING EXERCISES  These exercises are designed to help you keep full movement of your hip joint. Follow your caregiver's or physical therapist's instructions. Perform all exercises about fifteen times, three times per day or as directed. Exercise both hips, even if you have had only one joint replacement. These exercises can be done on a training (exercise) mat, on the floor, on a table or on a bed. Use whatever works the best and is most comfortable for you. Use music or television while you are exercising so that the exercises are a pleasant break in your day. This will make your life better with the exercises acting as a break in routine you can look forward to.  . Lying on your back, slowly slide your foot  toward your buttocks, raising your knee up off the floor. Then slowly slide your foot back down until your leg is straight again.  . Lying on your back spread your legs as far apart as you can without causing discomfort.  . Lying on your side, raise your upper leg and foot straight up from the floor as far as is comfortable. Slowly lower the leg and repeat.  . Lying on your back, tighten up the muscle in the front of your thigh (quadriceps muscles). You can do this by keeping your leg straight and trying to raise your heel off the floor. This helps strengthen the largest muscle supporting your knee.  . Lying on your back, tighten up the muscles of your buttocks both with the legs straight and with the knee bent at a comfortable angle while keeping your heel on the floor.   IF YOU ARE TRANSFERRED TO A SKILLED REHAB FACILITY If the patient is transferred to a skilled rehab facility following release from the hospital, a list of the current medications will be sent to the facility for the patient to continue.  When discharged from the skilled rehab facility, please have the facility set up the patient's [place] prior to being released. Also, the skilled facility will be responsible for providing the patient with their medications at time of release from the facility to include their pain medication, the muscle relaxants, and their blood thinner medication. If the patient is still at the rehab facility at time of the two week follow up appointment, the skilled rehab facility will also need to assist the patient in arranging follow up appointment in our office and any transportation needs.  MAKE SURE YOU:  . Understand these instructions.  . Get help right away if you are not doing well or get worse.    Pick up stool softner and laxative for home use following surgery while on pain medications. Do not submerge incision under water. Please use good hand washing techniques while  changing dressing each day. May shower starting three days after surgery. Please use a clean towel to pat the incision dry following showers. Continue to use ice for pain and swelling after surgery. Do not use any lotions or creams on the incision until instructed by your surgeon.

## 2019-07-06 NOTE — Anesthesia Procedure Notes (Addendum)
Spinal  Patient location during procedure: OR Start time: 07/06/2019 9:43 AM End time: 07/06/2019 9:48 AM Staffing Performed: anesthesiologist  Anesthesiologist: Albertha Ghee, MD Preanesthetic Checklist Completed: patient identified, IV checked, risks and benefits discussed, surgical consent, monitors and equipment checked, pre-op evaluation and timeout performed Spinal Block Patient position: sitting Prep: DuraPrep Patient monitoring: cardiac monitor, continuous pulse ox and blood pressure Approach: left paramedian Location: L3-4 Injection technique: single-shot Needle Needle type: Pencan  Needle gauge: 24 G Needle length: 9 cm Assessment Sensory level: T10 Additional Notes Functioning IV was confirmed and monitors were applied. Sterile prep and drape, including hand hygiene and sterile gloves were used. The patient was positioned and the spine was prepped. The skin was anesthetized with lidocaine.  Free flow of clear CSF was obtained prior to injecting local anesthetic into the CSF.  The spinal needle aspirated freely following injection.  The needle was carefully withdrawn.  The patient tolerated the procedure well.

## 2019-07-06 NOTE — Op Note (Signed)
OPERATIVE REPORT- TOTAL HIP ARTHROPLASTY   PREOPERATIVE DIAGNOSIS: Right femoral neck fracture  POSTOPERATIVE DIAGNOSIS: Right femoral neck fracture  PROCEDURE: Right total hip arthroplasty, anterior approach.   SURGEON: Gaynelle Arabian, MD   ASSISTANT: Griffith Citron, PA-C  ANESTHESIA:  Spinal  ESTIMATED BLOOD LOSS:- 300 ml  DRAINS: Hemovac x1.   COMPLICATIONS: None   CONDITION: PACU - hemodynamically stable.   BRIEF CLINICAL NOTE: John Bartlett is a 64 y.o. male who has advanced end-  stage arthritis of their Right  hip with progressively worsening pain and  Dysfunction.He presented to the office last week with an acute exacerbation of worsening pain and was found to have a displaced femoral neck fracture He presents today for right Total Hip Arthroplasty PROCEDURE IN DETAIL: After successful administration of spinal  anesthetic, the traction boots for the [facility] bed were placed on both  feet and the patient was placed onto the [facility] bed, boots placed into the leg  holders. The Right hip was then isolated from the perineum with plastic  drapes and prepped and draped in the usual sterile fashion. ASIS and  greater trochanter were marked and a oblique incision was made, starting  at about 1 cm lateral and 2 cm distal to the ASIS and coursing towards  the anterior cortex of the femur. The skin was cut with a 10 blade  through subcutaneous tissue to the level of the fascia overlying the  tensor fascia lata muscle. The fascia was then incised in line with the  incision at the junction of the anterior third and posterior 2/3rd. The  muscle was teased off the fascia and then the interval between the TFL  and the rectus was developed. The Hohmann retractor was then placed at  the top of the femoral neck over the capsule. The vessels overlying the  capsule were cauterized and the fat on top of the capsule was removed.  A Hohmann retractor was then placed anterior underneath  the rectus  femoris to give exposure to the entire anterior capsule. A T-shaped  capsulotomy was performed. The edges were tagged and the femoral head  was identified.       Osteophytes are removed off the superior acetabulum.  The femoral neck was then cut in situ with an oscillating saw. Traction  was then applied to the left lower extremity utilizing the [facility]  traction. The femoral head was then removed and the broken fragment subsequently removed from the acetabulum  The fracture appeared subacute as there were bony erosions at the fracture site And no evidence of a bloody effusion. Retractors were placed  around the acetabulum and then circumferential removal of the labrum was  performed. Osteophytes were also removed. Reaming starts at 49 mm to  medialize and  Increased in 2 mm increments to 55 mm. We reamed in  approximately 40 degrees of abduction, 20 degrees anteversion. A 56 mm  pinnacle acetabular shell was then impacted in anatomic position under  fluoroscopic guidance with excellent purchase. We did not need to place  any additional dome screws. A 36 mm neutral + 4 marathon liner was then  placed into the acetabular shell.       The femoral lift was then placed along the lateral aspect of the femur  just distal to the vastus ridge. The leg was  externally rotated and capsule  was stripped off the inferior aspect of the femoral neck down to the  level of the lesser trochanter, this  was done with electrocautery. The femur was lifted after this was performed. The  leg was then placed in an extended and adducted position essentially delivering the femur. We also removed the capsule superiorly and the piriformis from the piriformis fossa to gain excellent exposure of the  proximal femur. Rongeur was used to remove some cancellous bone to get  into the lateral portion of the proximal femur for placement of the  initial starter reamer. The starter broaches was placed  the starter  broach  and was shown to go down the center of the canal. Broaching  with the Actis system was then performed starting at size 0  coursing  Up to size 8. A size 8 had excellent torsional and rotational  and axial stability. The trial high offset neck was then placed  with a 36 + 5 trial head. The hip was then reduced. We confirmed that  the stem was in the canal both on AP and lateral x-rays. It also has excellent sizing. The hip was reduced with outstanding stability through full extension and full external rotation.. AP pelvis was taken and the leg lengths were measured and found to be equal. Hip was then dislocated again and the femoral head and neck removed. The  femoral broach was removed. Size 8 Actis stem with a high offset  neck was then impacted into the femur following native anteversion. Has  excellent purchase in the canal. Excellent torsional and rotational and  axial stability. It is confirmed to be in the canal on AP and lateral  fluoroscopic views. The 36 + 5 ceramic head was placed and the hip  reduced with outstanding stability. Again AP pelvis was taken and it  confirmed that the leg lengths were equal. The wound was then copiously  irrigated with saline solution and the capsule reattached and repaired  with Ethibond suture. 30 ml of .25% Bupivicaine was  injected into the capsule and into the edge of the tensor fascia lata as well as subcutaneous tissue. The fascia overlying the tensor fascia lata was then closed with a running #1 V-Loc. Subcu was closed with interrupted 2-0 Vicryl and subcuticular running 4-0 Monocryl. Incision was cleaned  and dried. Steri-Strips and a bulky sterile dressing applied. Hemovac  drain was hooked to suction and then the patient was awakened and transported to  recovery in stable condition.        Please note that a surgical assistant was a medical necessity for this procedure to perform it in a safe and expeditious manner. Assistant was necessary  to provide appropriate retraction of vital neurovascular structures and to prevent femoral fracture and allow for anatomic placement of the prosthesis.  Gaynelle Arabian, M.D.

## 2019-07-06 NOTE — Anesthesia Preprocedure Evaluation (Addendum)
Anesthesia Evaluation  Patient identified by MRN, date of birth, ID band Patient awake    Reviewed: Allergy & Precautions, H&P , NPO status , Patient's Chart, lab work & pertinent test results  Airway Mallampati: II   Neck ROM: full    Dental   Pulmonary neg pulmonary ROS,    breath sounds clear to auscultation       Cardiovascular hypertension,  Rhythm:regular Rate:Normal     Neuro/Psych    GI/Hepatic   Endo/Other  obese  Renal/GU      Musculoskeletal Right femoral neck fx   Abdominal   Peds  Hematology   Anesthesia Other Findings   Reproductive/Obstetrics                             Anesthesia Physical Anesthesia Plan  ASA: II  Anesthesia Plan: Spinal and MAC   Post-op Pain Management:    Induction: Intravenous  PONV Risk Score and Plan: 2 and Ondansetron, Midazolam, Treatment may vary due to age or medical condition and Propofol infusion  Airway Management Planned: Simple Face Mask  Additional Equipment:   Intra-op Plan:   Post-operative Plan:   Informed Consent: I have reviewed the patients History and Physical, chart, labs and discussed the procedure including the risks, benefits and alternatives for the proposed anesthesia with the patient or authorized representative who has indicated his/her understanding and acceptance.       Plan Discussed with: CRNA, Anesthesiologist and Surgeon  Anesthesia Plan Comments:        Anesthesia Quick Evaluation

## 2019-07-06 NOTE — Interval H&P Note (Signed)
History and Physical Interval Note:  07/06/2019 8:53 AM  John Bartlett  has presented today for surgery, with the diagnosis of Right displaced femoral neck fracture.  The various methods of treatment have been discussed with the patient and family. After consideration of risks, benefits and other options for treatment, the patient has consented to  Procedure(s) with comments: [place] (Right) - 164mins as a surgical intervention.  The patient's history has been reviewed, patient examined, no change in status, stable for surgery.  I have reviewed the patient's chart and labs.  Questions were answered to the patient's satisfaction.     Pilar Plate Letty Salvi

## 2019-07-06 NOTE — Evaluation (Signed)
Physical Therapy Evaluation Patient Details Name: John Bartlett MRN: [id number] DOB: 1956/02/13 Today's Date: 07/06/2019   History of Present Illness  Patient is 64 y.o. male s/p Rt THA anterior approach on 07/06/19 after finding of a femoral neck fracture with PMH significant for HTN.    Clinical Impression  Navid Fudge is a 64 y.o. male POD 0 s/p Rt THA anterior approach. Patient reports independence with mobility at baseline. Patient is now limited by functional impairments (see PT problem list below) and requires min assist for transfers and gait with RW. Patient was able to ambulate ~110 feet with RW and min guard for safety. Patient instructed in exercise to facilitate ROM and circulation. Patient will benefit from continued skilled PT interventions to address impairments and progress towards PLOF. Acute PT will follow to progress mobility and stair training in preparation for safe discharge home.    Follow Up Recommendations Follow surgeon's recommendation for DC plan and follow-up therapies    Equipment Recommendations  None recommended by PT    Recommendations for Other Services       Precautions / Restrictions Precautions Precautions: Fall Restrictions Weight Bearing Restrictions: No      Mobility  Bed Mobility Overal bed mobility: Needs Assistance Bed Mobility: Supine to Sit     Supine to sit: Min assist;HOB elevated     General bed mobility comments: light assist for Rt LE mobility to bring completely off EOB.  Transfers Overall transfer level: Needs assistance Equipment used: Rolling walker (2 wheeled) Transfers: Sit to/from Stand Sit to Stand: Min guard         General transfer comment: cues for safe hand placement and technique with RW, no assist required for power up but assist to steady walker with rise  Ambulation/Gait Ambulation/Gait assistance: Min guard Gait Distance (Feet): 110 Feet Assistive device: Rolling walker (2 wheeled) Gait  Pattern/deviations: Step-through pattern;Decreased stride length;Decreased weight shift to right;Decreased stance time - right Gait velocity: slight decrease   General Gait Details: cues for safe step pattern and proximity to RW, no overt LOB noted  Stairs            Wheelchair Mobility    Modified Rankin (Stroke Patients Only)       Balance Overall balance assessment: Needs assistance Sitting-balance support: Feet supported Sitting balance-Leahy Scale: Good     Standing balance support: Bilateral upper extremity supported;During functional activity Standing balance-Leahy Scale: Fair              Pertinent Vitals/Pain Pain Assessment: Faces Faces Pain Scale: Hurts little more Pain Location: Rt hip Pain Descriptors / Indicators: Aching;Sore Pain Intervention(s): Limited activity within patient's tolerance;Monitored during session;Repositioned;Ice applied    Home Living Family/patient expects to be discharged to:: Private residence Living Arrangements: Spouse/significant other Available Help at Discharge: Family;Available 24 hours/day Type of Home: House Home Access: Stairs to enter Entrance Stairs-Rails: None Entrance Stairs-Number of Steps: 1 threshold and 3 into house from utility room Home Layout: One level Home Equipment: [place] - 2 wheels;Shower seat - built in      Prior Function Level of Independence: Independent         Comments: pt ha had to use RW for last 2 weeks due to hip pain and fracture     Hand Dominance   Dominant Hand: Right    Extremity/Trunk Assessment   Upper Extremity Assessment Upper Extremity Assessment: Overall WFL for tasks assessed    Lower Extremity Assessment Lower Extremity Assessment: RLE deficits/detail;Overall [facility] for tasks  assessed RLE Deficits / Details: good quad activation RLE Sensation: WNL RLE Coordination: WNL    Cervical / Trunk Assessment Cervical / Trunk Assessment: Normal  Communication    Communication: No difficulties  Cognition Arousal/Alertness: Awake/alert Behavior During Therapy: WFL for tasks assessed/performed Overall Cognitive Status: Within Functional Limits for tasks assessed             General Comments      Exercises Total Joint Exercises Ankle Circles/Pumps: AROM;10 reps;Both;Seated Heel Slides: AROM;5 reps;Seated;Right Long Arc Quad: AROM;5 reps;Seated;Right   Assessment/Plan    PT Assessment Patient needs continued PT services  PT Problem List         PT Treatment Interventions DME instruction;Functional mobility training;Balance training;Patient/family education;Therapeutic activities;Gait training;Stair training;Therapeutic exercise    PT Goals (Current goals can be found in the Care Plan section)  Acute Rehab PT Goals Patient Stated Goal: to get back to dancing PT Goal Formulation: With patient Time For Goal Achievement: 07/13/19 Potential to Achieve Goals: Good    Frequency 7X/week    AM-PAC PT "6 Clicks" Mobility  Outcome Measure Help needed turning from your back to your side while in a flat bed without using bedrails?: A Little Help needed moving from lying on your back to sitting on the side of a flat bed without using bedrails?: A Little Help needed moving to and from a bed to a chair (including a wheelchair)?: A Little Help needed standing up from a chair using your arms (e.g., wheelchair or bedside chair)?: A Little Help needed to walk in hospital room?: A Little Help needed climbing 3-5 steps with a railing? : A Little 6 Click Score: 18    End of Session Equipment Utilized During Treatment: Gait belt Activity Tolerance: Patient tolerated treatment well Patient left: with call bell/phone within reach;in chair;with chair alarm set;with family/visitor present Nurse Communication: Mobility status PT Visit Diagnosis: Muscle weakness (generalized) (M62.81);Difficulty in walking, not elsewhere classified (R26.2)    Time:  [id number] PT Time Calculation (min) (ACUTE ONLY): 15 min   Charges:   PT Evaluation $PT Eval Low Complexity: 1 Low          Verner Mould, DPT Physical Therapist with [facility] [phone]  07/06/2019 5:05 PM

## 2019-07-06 NOTE — Progress Notes (Signed)
Pt. PCR was positive for STAPH.

## 2019-07-06 NOTE — Transfer of Care (Signed)
Immediate Anesthesia Transfer of Care Note  Patient: John Bartlett  Procedure(s) Performed: Procedure(s) with comments: TOTAL HIP ARTHROPLASTY ANTERIOR APPROACH (Right) - 131mins  Patient Location: PACU  Anesthesia Type:Spinal  Level of Consciousness: awake, alert  and oriented  Airway & Oxygen Therapy: Patient Spontanous Breathing  Post-op Assessment: Report given to RN and Post -op Vital signs reviewed and stable  Post vital signs: Reviewed and stable  Last Vitals:  Vitals:   07/06/19 0723  BP: (!) 141/76  Pulse: 89  Resp: 11  Temp: 36.7 C  SpO2: [id number]    Complications: No apparent anesthesia complications

## 2019-07-07 ENCOUNTER — Encounter: Payer: Self-pay | Admitting: *Deleted

## 2019-07-07 LAB — CBC
HCT: 42.6 % (ref 39.0–52.0)
Hemoglobin: 14.2 g/dL (ref 13.0–17.0)
MCH: 31.2 pg (ref 26.0–34.0)
MCHC: 33.3 g/dL (ref 30.0–36.0)
MCV: 93.6 fL (ref 80.0–100.0)
Platelets: 180 10*3/uL (ref 150–400)
RBC: 4.55 MIL/uL (ref 4.22–5.81)
RDW: 11.9 % (ref 11.5–15.5)
WBC: 8.3 10*3/uL (ref 4.0–10.5)
nRBC: 0 % (ref 0.0–0.2)

## 2019-07-07 LAB — BASIC METABOLIC PANEL
Anion gap: 10 (ref 5–15)
BUN: 12 mg/dL (ref 8–23)
CO2: 24 mmol/L (ref 22–32)
Calcium: 8.8 mg/dL — ABNORMAL LOW (ref 8.9–10.3)
Chloride: 98 mmol/L (ref 98–111)
Creatinine, Ser: 0.79 mg/dL (ref 0.61–1.24)
GFR calc Af Amer: >60 mL/min (ref >60)
GFR calc non Af Amer: >60 mL/min (ref >60)
Glucose, Bld: 116 mg/dL — ABNORMAL HIGH (ref 70–99)
Potassium: 4.9 mmol/L (ref 3.5–5.1)
Sodium: 132 mmol/L — ABNORMAL LOW (ref 135–145)

## 2019-07-07 MED ORDER — TRAMADOL HCL 50 MG PO TABS: 50.0000 mg | ORAL_TABLET | Freq: Four times a day (QID) | ORAL | 0 refills | Status: AC | PRN

## 2019-07-07 MED ORDER — CHLORHEXIDINE GLUCONATE CLOTH 2 % EX PADS: 6.0000 | MEDICATED_PAD | Freq: Every day | CUTANEOUS | Status: DC

## 2019-07-07 MED ORDER — ASPIRIN 325 MG PO TBEC: 325.0000 mg | DELAYED_RELEASE_TABLET | Freq: Two times a day (BID) | ORAL | 0 refills | Status: AC

## 2019-07-07 MED ORDER — METHOCARBAMOL 500 MG PO TABS: 500.0000 mg | ORAL_TABLET | Freq: Four times a day (QID) | ORAL | 0 refills | Status: AC | PRN

## 2019-07-07 MED ORDER — HYDROCODONE-ACETAMINOPHEN 5-325 MG PO TABS: 1.0000 | ORAL_TABLET | Freq: Four times a day (QID) | ORAL | 0 refills | Status: AC | PRN

## 2019-07-07 NOTE — Progress Notes (Signed)
   Subjective: 1 Day Post-Op Procedure(s) (LRB): TOTAL HIP ARTHROPLASTY ANTERIOR APPROACH (Right) Patient reports pain as moderate.   Patient seen in rounds with Dr. Wynelle Link. Patient is well, and has had no acute complaints or problems other than pain in the right hip. Denies chest pain or SOB. Foley catheter to be removed this AM. No issues overnight.  We will continue therapy today, ambulated 110' yesterday.  Objective: Vital signs in last 24 hours: Temp:  [97.4 F (36.3 C)-98.3 F (36.8 C)] 98.3 F (36.8 C) (01/28 0519) Pulse Rate:  [70-104] 104 (01/28 0519) Resp:  [11-18] 16 (01/28 0519) BP: (106-159)/(43-101) 157/101 (01/28 0519) SpO2:  [78 %-100 %] 78 % (01/28 0519) Weight:  [113.4 kg] 113.4 kg (01/27 1419)  Intake/Output from previous day:  Intake/Output Summary (Last 24 hours) at 07/07/2019 0715 Last data filed at 07/07/2019 0519 Gross per 24 hour  Intake 2794.96 ml  Output 3680 ml  Net -885.04 ml   Labs: Recent Labs    07/05/19 1551 07/07/19 0353  HGB 15.3 14.2   Recent Labs    07/05/19 1551 07/07/19 0353  WBC 5.5 8.3  RBC 5.02 4.55  HCT 47.2 42.6  PLT 225 180   Recent Labs    07/05/19 1551 07/07/19 0353  NA 136 132*  K 4.3 4.9  CL 99 98  CO2 29 24  BUN 15 12  CREATININE 0.69 0.79  GLUCOSE 113* 116*  CALCIUM 9.4 8.8*   Recent Labs    07/05/19 1551  INR 1.2    Exam: General - Patient is Alert and Oriented Extremity - Neurologically intact Neurovascular intact Sensation intact distally Dorsiflexion/Plantar flexion intact Dressing - dressing C/D/I Motor Function - intact, moving foot and toes well on exam.   Past Medical History:  Diagnosis Date  . Cancer ([place]) 2020   SKIN CANCER ON FACE WAS REMOVED  . Hypertension     Assessment/Plan: 1 Day Post-Op Procedure(s) (LRB): TOTAL HIP ARTHROPLASTY ANTERIOR APPROACH (Right) Principal Problem:   Fracture of femoral neck, right (HCC) Active Problems:   Closed displaced fracture of  right femoral neck (HCC)  Estimated body mass index is 33.91 kg/m as calculated from the following:   Height as of this encounter: 6' (1.829 m).   Weight as of this encounter: 113.4 kg. Advance diet Up with therapy D/C IV fluids  DVT Prophylaxis - Aspirin Weight bearing as tolerated. D/C O2 and pulse ox and try on room air. Hemovac pulled without difficulty, will continue therapy.  Plan is to go Home after hospital stay. Plan for discharge later today if progresses with therapy and is meeting his goals with HEP.  Follow-up in the office in 2 weeks.   Theresa Duty, PA-C Orthopedic Surgery 07/07/2019, 7:15 AM

## 2019-07-07 NOTE — Progress Notes (Signed)
Physical Therapy Treatment Patient Details Name: John Bartlett MRN: [id number] DOB: [birth year] Today's Date: 07/07/2019    History of Present Illness Patient is 64 y.o. male s/p Rt THA anterior approach on 07/06/19 after finding of a femoral neck fracture with PMH significant for HTN.    PT Comments    Pt motivated and eager to dc home.  Pt reviewed written HEP including progression as well as car transfers and stairs with spouse present and written instruction provided.  Follow Up Recommendations  Follow surgeon's recommendation for DC plan and follow-up therapies     Equipment Recommendations  None recommended by PT    Recommendations for Other Services       Precautions / Restrictions Precautions Precautions: Fall Restrictions Weight Bearing Restrictions: No    Mobility  Bed Mobility Overal bed mobility: Needs Assistance Bed Mobility: Supine to Sit     Supine to sit: HOB elevated;Min guard     General bed mobility comments: Pt up in chair and requests back to same  Transfers Overall transfer level: Needs assistance Equipment used: Rolling walker (2 wheeled) Transfers: Sit to/from Stand Sit to Stand: Supervision         General transfer comment: cues for LE management and use of UEs to self assist  Ambulation/Gait Ambulation/Gait assistance: Min guard;Supervision Gait Distance (Feet): 140 Feet Assistive device: Rolling walker (2 wheeled) Gait Pattern/deviations: Step-to pattern;Step-through pattern;Decreased step length - right;Decreased step length - left;Shuffle;Antalgic;Trunk flexed Gait velocity: decr   General Gait Details: cues for posture and position from RW;    Stairs Stairs: Yes Stairs assistance: Min assist Stair Management: No rails Number of Stairs: 9 General stair comments: single step fwd and bkwd with RW; two step bkwd with RW;  two step and three step fwd with RW at top of step.  Spouse present;    Wheelchair Mobility     Modified Rankin (Stroke Patients Only)       Balance Overall balance assessment: Needs assistance Sitting-balance support: Feet supported Sitting balance-Leahy Scale: Good     Standing balance support: Bilateral upper extremity supported;During functional activity Standing balance-Leahy Scale: Fair                              Cognition Arousal/Alertness: Awake/alert Behavior During Therapy: WFL for tasks assessed/performed Overall Cognitive Status: Within Functional Limits for tasks assessed                                        Exercises Total Joint Exercises Ankle Circles/Pumps: AROM;10 reps;Both;Seated Quad Sets: AROM;Both;10 reps;Supine Heel Slides: Right;AAROM;20 reps;Supine Hip ABduction/ADduction: AAROM;Left;15 reps;Supine Long Arc Quad: AROM;Seated;Right;10 reps    General Comments        Pertinent Vitals/Pain Pain Assessment: 0-10 Pain Score: 6  Pain Location: Rt hip Pain Descriptors / Indicators: Aching;Sore Pain Intervention(s): Limited activity within patient's tolerance;Monitored during session;Premedicated before session;Ice applied    Home Living                      Prior Function            PT Goals (current goals can now be found in the care plan section) Acute Rehab PT Goals Patient Stated Goal: to get back to dancing PT Goal Formulation: With patient Time For Goal Achievement: 07/13/19 Potential to Achieve Goals: Good Progress towards PT  goals: Progressing toward goals    Frequency    7X/week      PT Plan Current plan remains appropriate    Co-evaluation              AM-PAC PT "6 Clicks" Mobility   Outcome Measure  Help needed turning from your back to your side while in a flat bed without using bedrails?: A Little Help needed moving from lying on your back to sitting on the side of a flat bed without using bedrails?: A Little Help needed moving to and from a bed to a chair  (including a wheelchair)?: A Little Help needed standing up from a chair using your arms (e.g., wheelchair or bedside chair)?: A Little Help needed to walk in hospital room?: A Little Help needed climbing 3-5 steps with a railing? : A Little 6 Click Score: 18    End of Session Equipment Utilized During Treatment: Gait belt Activity Tolerance: Patient tolerated treatment well Patient left: with call bell/phone within reach;in chair;with chair alarm set;with family/visitor present Nurse Communication: Mobility status PT Visit Diagnosis: Muscle weakness (generalized) (M62.81);Difficulty in walking, not elsewhere classified (R26.2)     Time: [id number] PT Time Calculation (min) (ACUTE ONLY): 32 min  Charges:  $Gait Training: 8-22 mins $Therapeutic Exercise: 8-22 mins $Therapeutic Activity: 8-22 mins                     Debe Coder PT Acute Rehabilitation Services Pager [phone] Office [phone]    Shawnell Dykes 07/07/2019, 12:34 PM

## 2019-07-07 NOTE — Plan of Care (Signed)
Pt ready for DC home today 

## 2019-07-07 NOTE — Progress Notes (Signed)
Physical Therapy Treatment Patient Details Name: John Bartlett MRN: [id number] DOB: November 09, 1955 Today's Date: 07/07/2019    History of Present Illness Patient is 64 y.o. male s/p Rt THA anterior approach on 07/06/19 after finding of a femoral neck fracture with PMH significant for HTN.    PT Comments    Pt motivated and progressing with mobility despite c/o increased pain this am.  Pt hopeful for dc home this pm.   Follow Up Recommendations  Follow surgeon's recommendation for DC plan and follow-up therapies     Equipment Recommendations  None recommended by PT    Recommendations for Other Services       Precautions / Restrictions Precautions Precautions: Fall Restrictions Weight Bearing Restrictions: No    Mobility  Bed Mobility Overal bed mobility: Needs Assistance Bed Mobility: Supine to Sit     Supine to sit: HOB elevated;Min guard     General bed mobility comments: Pt self assisting R LE with belt  Transfers Overall transfer level: Needs assistance Equipment used: Rolling walker (2 wheeled) Transfers: Sit to/from Stand Sit to Stand: Min guard         General transfer comment: cues for safe hand placement and technique with RW, no assist required for power up but assist to steady walker with rise  Ambulation/Gait Ambulation/Gait assistance: Min guard Gait Distance (Feet): 140 Feet Assistive device: Rolling walker (2 wheeled) Gait Pattern/deviations: Step-to pattern;Step-through pattern;Decreased step length - right;Decreased step length - left;Shuffle;Antalgic;Trunk flexed Gait velocity: decr   General Gait Details: cues for posture and position from RW;    Chief Strategy Officer    Modified Rankin (Stroke Patients Only)       Balance Overall balance assessment: Needs assistance Sitting-balance support: Feet supported Sitting balance-Leahy Scale: Good     Standing balance support: Bilateral upper extremity  supported;During functional activity Standing balance-Leahy Scale: Fair                              Cognition Arousal/Alertness: Awake/alert Behavior During Therapy: WFL for tasks assessed/performed Overall Cognitive Status: Within Functional Limits for tasks assessed                                        Exercises Total Joint Exercises Ankle Circles/Pumps: AROM;10 reps;Both;Seated Quad Sets: AROM;Both;10 reps;Supine Heel Slides: Right;AAROM;20 reps;Supine Hip ABduction/ADduction: AAROM;Left;15 reps;Supine Long Arc Quad: AROM;Seated;Right;10 reps    General Comments        Pertinent Vitals/Pain Pain Assessment: 0-10 Pain Score: 7  Pain Location: Rt hip Pain Descriptors / Indicators: Aching;Sore Pain Intervention(s): Limited activity within patient's tolerance;Monitored during session;Premedicated before session;Ice applied    Home Living                      Prior Function            PT Goals (current goals can now be found in the care plan section) Acute Rehab PT Goals Patient Stated Goal: to get back to dancing PT Goal Formulation: With patient Time For Goal Achievement: 07/13/19 Potential to Achieve Goals: Good Progress towards PT goals: Progressing toward goals    Frequency    7X/week      PT Plan Current plan remains appropriate    Co-evaluation  AM-PAC PT "6 Clicks" Mobility   Outcome Measure  Help needed turning from your back to your side while in a flat bed without using bedrails?: A Little Help needed moving from lying on your back to sitting on the side of a flat bed without using bedrails?: A Little Help needed moving to and from a bed to a chair (including a wheelchair)?: A Little Help needed standing up from a chair using your arms (e.g., wheelchair or bedside chair)?: A Little Help needed to walk in hospital room?: A Little Help needed climbing 3-5 steps with a railing? : A  Little 6 Click Score: 18    End of Session Equipment Utilized During Treatment: Gait belt Activity Tolerance: Patient tolerated treatment well Patient left: with call bell/phone within reach;in chair;with chair alarm set Nurse Communication: Mobility status PT Visit Diagnosis: Muscle weakness (generalized) (M62.81);Difficulty in walking, not elsewhere classified (R26.2)     Time: [id number] PT Time Calculation (min) (ACUTE ONLY): 36 min  Charges:  $Gait Training: 8-22 mins $Therapeutic Exercise: 8-22 mins                     [place] Pager [phone] Office [phone]    Shilo Philipson 07/07/2019, 10:59 AM

## 2019-07-08 LAB — SURGICAL PATHOLOGY

## 2019-07-08 NOTE — Discharge Summary (Signed)
Physician Discharge Summary   Patient ID: John Bartlett MRN: [id number] DOB/AGE: [birth year] 64 y.o.  Admit date: 07/06/2019 Discharge date: 07/07/2019  Primary Diagnosis: Right femoral neck fracture   Admission Diagnoses:  Past Medical History:  Diagnosis Date  . Cancer ([place]) 2020   SKIN CANCER ON FACE WAS REMOVED  . Hypertension    Discharge Diagnoses:   Principal Problem:   Fracture of femoral neck, right (HCC) Active Problems:   Closed displaced fracture of right femoral neck (HCC)  Estimated body mass index is 33.91 kg/m as calculated from the following:   Height as of this encounter: 6' (1.829 m).   Weight as of this encounter: 113.4 kg.  Procedure:  Procedure(s) (LRB): TOTAL HIP ARTHROPLASTY ANTERIOR APPROACH (Right)   Consults: None  HPI: John Bartlett is a 64 y.o. male who has advanced end-stage arthritis of their Right  hip with progressively worsening pain and dysfunction.He presented to the office last week with an acute exacerbation of worsening pain and was found to have a displaced femoral neck fracture. He presents today for right Total Hip Arthroplasty.  Laboratory Data: Admission on 07/06/2019, Discharged on 07/07/2019  Component Date Value Ref Range Status  . WBC 07/07/2019 8.3  4.0 - 10.5 K/uL Final  . RBC 07/07/2019 4.55  4.22 - 5.81 MIL/uL Final  . Hemoglobin 07/07/2019 14.2  13.0 - 17.0 g/dL Final  . HCT 07/07/2019 42.6  39.0 - 52.0 % Final  . MCV 07/07/2019 93.6  80.0 - 100.0 fL Final  . MCH 07/07/2019 31.2  26.0 - 34.0 pg Final  . MCHC 07/07/2019 33.3  30.0 - 36.0 g/dL Final  . RDW 07/07/2019 11.9  11.5 - 15.5 % Final  . Platelets 07/07/2019 180  150 - 400 K/uL Final  . nRBC 07/07/2019 0.0  0.0 - 0.2 % Final   Performed at [facility], [place] [address], [place], [place] [address]  . Sodium 07/07/2019 132* 135 - 145 mmol/L Final  . Potassium 07/07/2019 4.9  3.5 - 5.1 mmol/L Final  . Chloride 07/07/2019 98  98 - 111 mmol/L  Final  . CO2 07/07/2019 24  22 - 32 mmol/L Final  . Glucose, Bld 07/07/2019 116* 70 - 99 mg/dL Final  . BUN 07/07/2019 12  8 - 23 mg/dL Final  . Creatinine, Ser 07/07/2019 0.79  0.61 - 1.24 mg/dL Final  . Calcium 07/07/2019 8.8* 8.9 - 10.3 mg/dL Final  . GFR calc non Af Amer 07/07/2019 >60  >60 mL/min Final  . GFR calc Af Amer 07/07/2019 >60  >60 mL/min Final  . Anion gap 07/07/2019 10  5 - 15 Final   Performed at [facility], [place] [address], [place], [place] [address]  Hospital Outpatient Visit on 07/05/2019  Component Date Value Ref Range Status  . MRSA, PCR 07/05/2019 NEGATIVE  NEGATIVE Final  . Staphylococcus aureus 07/05/2019 POSITIVE* NEGATIVE Final   Comment: (NOTE) The Xpert SA Assay (FDA approved for NASAL specimens in patients 61 years of age and older), is one component of a comprehensive surveillance program. It is not intended to diagnose infection nor to guide or monitor treatment. Performed at [facility], [place] [address], [place], [place] [address]   . aPTT 07/05/2019 32  24 - 36 seconds Final   Performed at [facility], [place] [address], [place], [place] [address]  . WBC 07/05/2019 5.5  4.0 - 10.5 K/uL Final  . RBC 07/05/2019 5.02  4.22 - 5.81  MIL/uL Final  . Hemoglobin 07/05/2019 15.3  13.0 - 17.0 g/dL Final  . HCT 07/05/2019 47.2  39.0 - 52.0 % Final  . MCV 07/05/2019 94.0  80.0 - 100.0 fL Final  . MCH 07/05/2019 30.5  26.0 - 34.0 pg Final  . MCHC 07/05/2019 32.4  30.0 - 36.0 g/dL Final  . RDW 07/05/2019 12.0  11.5 - 15.5 % Final  . Platelets 07/05/2019 225  150 - 400 K/uL Final  . nRBC 07/05/2019 0.0  0.0 - 0.2 % Final   Performed at [facility], [place] [address], [place], [place] [address]  . Sodium 07/05/2019 136  135 - 145 mmol/L Final  . Potassium 07/05/2019 4.3  3.5 - 5.1 mmol/L Final  . Chloride 07/05/2019 99  98 - 111 mmol/L Final  . CO2 07/05/2019 29  22 - 32 mmol/L Final  .  Glucose, Bld 07/05/2019 113* 70 - 99 mg/dL Final  . BUN 07/05/2019 15  8 - 23 mg/dL Final  . Creatinine, Ser 07/05/2019 0.69  0.61 - 1.24 mg/dL Final  . Calcium 07/05/2019 9.4  8.9 - 10.3 mg/dL Final  . Total Protein 07/05/2019 7.1  6.5 - 8.1 g/dL Final  . Albumin 07/05/2019 4.2  3.5 - 5.0 g/dL Final  . AST 07/05/2019 25  15 - 41 U/L Final  . ALT 07/05/2019 33  0 - 44 U/L Final  . Alkaline Phosphatase 07/05/2019 91  38 - 126 U/L Final  . Total Bilirubin 07/05/2019 0.8  0.3 - 1.2 mg/dL Final  . GFR calc non Af Amer 07/05/2019 >60  >60 mL/min Final  . GFR calc Af Amer 07/05/2019 >60  >60 mL/min Final  . Anion gap 07/05/2019 8  5 - 15 Final   Performed at [facility], [place] [address] [place], [place] [address]  . Prothrombin Time 07/05/2019 14.7  11.4 - 15.2 seconds Final  . INR 07/05/2019 1.2  0.8 - 1.2 Final   Comment: (NOTE) INR goal varies based on device and disease states. Performed at [facility], [place] [address] [place], [place] [address]   . ABO/RH(D) 07/05/2019 A NEG   Final  . Antibody Screen 07/05/2019 NEG   Final  . Sample Expiration 07/05/2019 07/09/2019,2359   Final  . Extend sample reason 07/05/2019    Final                   Value:NO TRANSFUSIONS OR PREGNANCY IN THE PAST 3 MONTHS Performed at [place] [address], [place], [place] [address]   . ABO/RH(D) 07/05/2019    Final                   Value:A NEG Performed at [facility], [place] [address] [place], [place] [address]   Hospital Outpatient Visit on 07/02/2019  Component Date Value Ref Range Status  . SARS Coronavirus 2 07/02/2019 NEGATIVE  NEGATIVE Final   Comment: (NOTE) SARS-CoV-2 target nucleic acids are NOT DETECTED. The SARS-CoV-2 RNA is generally detectable in upper and lower respiratory specimens during the acute phase of infection. Negative results do not preclude SARS-CoV-2 infection, do not rule out co-infections  with other pathogens, and should not be used as the sole basis for treatment or other patient management decisions. Negative results must be combined with clinical observations, patient history, and epidemiological information. The expected result is Negative. Fact Sheet for Patients: SugarRoll.be Fact Sheet for Healthcare Providers: https://www.woods-mathews.com/ This test is  not yet approved or cleared by the [place] and  has been authorized for detection and/or diagnosis of SARS-CoV-2 by FDA under an Emergency Use Authorization (EUA). This EUA will remain  in effect (meaning this test can be used) for the duration of the COVID-19 declaration under Section 56                          4(b)(1) of the Act, 21 U.S.C. section 360bbb-3(b)(1), unless the authorization is terminated or revoked sooner. Performed at [facility], [place] [address] [place], [place] [address]      X-Rays:DG Pelvis Portable  Result Date: 07/06/2019 CLINICAL DATA:  Status post right hip replacement. EXAM: PORTABLE PELVIS 1-2 VIEWS COMPARISON:  03/09/2019 FINDINGS: AP view of the lower pelvis and proximal thighs shows the patient to be status post left total hip arthroplasty. No evidence for immediate hardware complications. Surgical drain overlies the soft tissues. IMPRESSION: No evidence for immediate hardware complications status post right total hip replacement. Electronically Signed   By: Misty Stanley M.D.   On: 07/06/2019 11:47   DG C-Arm 1-60 Min-No Report  Result Date: 07/06/2019 Fluoroscopy was utilized by the requesting physician.  No radiographic interpretation.   DG HIP OPERATIVE UNILAT W OR W/O PELVIS RIGHT  Result Date: 07/06/2019 CLINICAL DATA:  Hip replacement EXAM: OPERATIVE RIGHT HIP (WITH PELVIS IF PERFORMED) 1 VIEW TECHNIQUE: Fluoroscopic spot image(s) were submitted for interpretation post-operatively. FLUOROSCOPY TIME:  7 seconds  COMPARISON:  None. FINDINGS: Frontal intraoperative radiographs demonstrate right total hip arthroplasty. IMPRESSION: Fluoroscopic guidance for right total hip arthroplasty. Electronically Signed   By: Macy Mis M.D.   On: 07/06/2019 11:08    EKG: Orders placed or performed during the hospital encounter of 07/05/19  . EKG 12 lead  . EKG 12 lead     Hospital Course: John Bartlett is a 64 y.o. who was admitted to [facility]. They were brought to the operating room on 07/06/2019 and underwent Procedure(s): [place].  Patient tolerated the procedure well and was later transferred to the recovery room and then to the orthopaedic floor for postoperative care. They were given PO and IV analgesics for pain control following their surgery. They were given 24 hours of postoperative antibiotics of  Anti-infectives (From admission, onward)   Start     Dose/Rate Route Frequency Ordered Stop   07/06/19 1530  ceFAZolin (ANCEF) IVPB 2g/100 mL premix     2 g 200 mL/hr over 30 Minutes Intravenous Every 6 hours 07/06/19 0717 07/06/19 2146   07/06/19 0716  ceFAZolin (ANCEF) 2-4 GM/100ML-% IVPB    Note to Pharmacy: Charmayne Sheer   : cabinet override      07/06/19 0716 07/06/19 1005   07/06/19 0715  ceFAZolin (ANCEF) IVPB 2g/100 mL premix     2 g 200 mL/hr over 30 Minutes Intravenous On call to O.R. 07/06/19 [id number] 07/06/19 0931     and started on DVT prophylaxis in the form of Aspirin.   PT and OT were ordered for total joint protocol. Discharge planning consulted to help with postop disposition and equipment needs.  Patient had a good night on the evening of surgery. They started to get up OOB with therapy on POD #0. Pt was seen during rounds and was ready to go home pending progress with therapy. Hemovac drain was pulled without difficulty. He worked with therapy on POD #1 and was meeting his  goals. Pt was discharged to home later that day in stable  condition.  Diet: Regular diet Activity: WBAT Follow-up: in 2 weeks Disposition: Home with HEP Discharged Condition: stable   Discharge Instructions    Call MD / Call 911   Complete by: As directed    If you experience chest pain or shortness of breath, CALL 911 and be transported to the hospital emergency room.  If you develope a fever above 101 F, pus (white drainage) or increased drainage or redness at the wound, or calf pain, call your surgeon's office.   Change dressing   Complete by: As directed    You may change your dressing on Friday, then change the dressing daily with sterile 4 x 4 inch gauze dressing and paper tape.   Constipation Prevention   Complete by: As directed    Drink plenty of fluids.  Prune juice may be helpful.  You may use a stool softener, such as Colace (over the counter) 100 mg twice a day.  Use MiraLax (over the counter) for constipation as needed.   Diet - low sodium heart healthy   Complete by: As directed    Do not sit on low chairs, stoools or toilet seats, as it may be difficult to get up from low surfaces   Complete by: As directed    Driving restrictions   Complete by: As directed    No driving for two weeks   TED hose   Complete by: As directed    Use stockings (TED hose) for three weeks on both leg(s).  You may remove them at night for sleeping.   Weight bearing as tolerated   Complete by: As directed      Allergies as of 07/07/2019      Reactions   Penicillins Itching   Did it involve swelling of the face/tongue/throat, SOB, or low BP? No Did it involve sudden or severe rash/hives, skin peeling, or any reaction on the inside of your mouth or nose? No Did you need to seek medical attention at a hospital or doctor's office? No When did it last happen?45 years ago If all above answers are "NO", may proceed with cephalosporin use.      Medication List    STOP taking these medications   HYDROcodone-acetaminophen 10-325 MG  tablet Commonly known as: NORCO Replaced by: HYDROcodone-acetaminophen 5-325 MG tablet     TAKE these medications   aspirin 325 MG EC tablet Take 1 tablet (325 mg total) by mouth 2 (two) times daily for 21 days. Then resume one 81 mg aspirin once a day. What changed:   medication strength  how much to take  when to take this  additional instructions   HYDROcodone-acetaminophen 5-325 MG tablet Commonly known as: NORCO/VICODIN Take 1-2 tablets by mouth every 6 (six) hours as needed for severe pain. Replaces: HYDROcodone-acetaminophen 10-325 MG tablet   lisinopril 20 MG tablet Commonly known as: ZESTRIL Take 20 mg by mouth every Monday, Wednesday, and Friday.   methocarbamol 500 MG tablet Commonly known as: ROBAXIN Take 1 tablet (500 mg total) by mouth every 6 (six) hours as needed for muscle spasms.   omeprazole 20 MG capsule Commonly known as: PRILOSEC Take 20 mg by mouth every Monday, Wednesday, and Friday.   rosuvastatin 40 MG tablet Commonly known as: CRESTOR Take 40 mg by mouth every Monday, Wednesday, and Friday.   traMADol 50 MG tablet Commonly known as: ULTRAM Take 1-2 tablets (50-100 mg total) by mouth every  6 (six) hours as needed for moderate pain.            Discharge Care Instructions  (From admission, onward)         Start     Ordered   07/07/19 0000  Weight bearing as tolerated     07/07/19 0802   07/07/19 0000  Change dressing    Comments: You may change your dressing on Friday, then change the dressing daily with sterile 4 x 4 inch gauze dressing and paper tape.   07/07/19 0802         Follow-up Information    Gaynelle Arabian, MD. Schedule an appointment as soon as possible for a visit on 07/20/2019.   Specialty: Orthopedic Surgery Contact information: [address] [place] [address] [phone]           Signed: Theresa Duty, PA-C Orthopedic Surgery 07/08/2019, 7:14 AM

## 2019-07-12 ENCOUNTER — Encounter: Payer: Self-pay | Admitting: Anesthesiology

## 2019-07-12 DIAGNOSIS — L570 Actinic keratosis: Secondary | ICD-10-CM | POA: Diagnosis not present

## 2019-07-12 NOTE — Anesthesia Postprocedure Evaluation (Signed)
Anesthesia Post Note  Patient: John Bartlett  Procedure(s) Performed: TOTAL HIP ARTHROPLASTY ANTERIOR APPROACH (Right Hip)     Patient location during evaluation: PACU Anesthesia Type: MAC and Spinal Level of consciousness: oriented and awake and alert Pain management: pain level controlled Vital Signs Assessment: post-procedure vital signs reviewed and stable Respiratory status: spontaneous breathing, respiratory function stable and patient connected to nasal cannula oxygen Cardiovascular status: blood pressure returned to baseline and stable Postop Assessment: no headache, no backache and no apparent nausea or vomiting Anesthetic complications: no    Last Vitals:  Vitals:   07/07/19 1030 07/07/19 1220  BP: 140/79   Pulse: 82   Resp: 18   Temp: 36.8 C   SpO2: (!) 83% 97%    Last Pain:  Vitals:   07/07/19 1058  TempSrc:   PainSc: Mobile

## 2019-08-01 DIAGNOSIS — H6123 Impacted cerumen, bilateral: Secondary | ICD-10-CM | POA: Diagnosis not present

## 2019-08-01 DIAGNOSIS — F410 Panic disorder [episodic paroxysmal anxiety] without agoraphobia: Secondary | ICD-10-CM | POA: Diagnosis not present

## 2019-08-01 DIAGNOSIS — J310 Chronic rhinitis: Secondary | ICD-10-CM | POA: Diagnosis not present

## 2019-08-01 DIAGNOSIS — Z6839 Body mass index (BMI) 39.0-39.9, adult: Secondary | ICD-10-CM | POA: Diagnosis not present

## 2019-08-09 DIAGNOSIS — Z96641 Presence of right artificial hip joint: Secondary | ICD-10-CM | POA: Diagnosis not present

## 2019-08-09 DIAGNOSIS — Z471 Aftercare following joint replacement surgery: Secondary | ICD-10-CM | POA: Diagnosis not present

## 2019-08-29 DIAGNOSIS — Z6833 Body mass index (BMI) 33.0-33.9, adult: Secondary | ICD-10-CM | POA: Diagnosis not present

## 2019-08-29 DIAGNOSIS — R351 Nocturia: Secondary | ICD-10-CM | POA: Diagnosis not present

## 2019-08-29 DIAGNOSIS — F410 Panic disorder [episodic paroxysmal anxiety] without agoraphobia: Secondary | ICD-10-CM | POA: Diagnosis not present

## 2019-11-14 DIAGNOSIS — R739 Hyperglycemia, unspecified: Secondary | ICD-10-CM | POA: Diagnosis not present

## 2019-11-14 DIAGNOSIS — E7800 Pure hypercholesterolemia, unspecified: Secondary | ICD-10-CM | POA: Diagnosis not present

## 2019-11-18 DIAGNOSIS — I10 Essential (primary) hypertension: Secondary | ICD-10-CM | POA: Diagnosis not present

## 2019-11-18 DIAGNOSIS — G4700 Insomnia, unspecified: Secondary | ICD-10-CM | POA: Diagnosis not present

## 2019-11-18 DIAGNOSIS — E7800 Pure hypercholesterolemia, unspecified: Secondary | ICD-10-CM | POA: Diagnosis not present

## 2019-11-18 DIAGNOSIS — R7303 Prediabetes: Secondary | ICD-10-CM | POA: Diagnosis not present

## 2019-11-18 DIAGNOSIS — Z23 Encounter for immunization: Secondary | ICD-10-CM | POA: Diagnosis not present

## 2020-01-10 DIAGNOSIS — L570 Actinic keratosis: Secondary | ICD-10-CM | POA: Diagnosis not present

## 2020-05-23 DIAGNOSIS — E7800 Pure hypercholesterolemia, unspecified: Secondary | ICD-10-CM | POA: Diagnosis not present

## 2020-05-23 DIAGNOSIS — R7303 Prediabetes: Secondary | ICD-10-CM | POA: Diagnosis not present

## 2020-05-23 DIAGNOSIS — Z125 Encounter for screening for malignant neoplasm of prostate: Secondary | ICD-10-CM | POA: Diagnosis not present

## 2020-05-25 DIAGNOSIS — Z1331 Encounter for screening for depression: Secondary | ICD-10-CM | POA: Diagnosis not present

## 2020-05-25 DIAGNOSIS — I10 Essential (primary) hypertension: Secondary | ICD-10-CM | POA: Diagnosis not present

## 2020-05-25 DIAGNOSIS — Z79899 Other long term (current) drug therapy: Secondary | ICD-10-CM | POA: Diagnosis not present

## 2020-05-25 DIAGNOSIS — G4700 Insomnia, unspecified: Secondary | ICD-10-CM | POA: Diagnosis not present

## 2020-05-25 DIAGNOSIS — R7303 Prediabetes: Secondary | ICD-10-CM | POA: Diagnosis not present

## 2020-05-25 DIAGNOSIS — Z9181 History of falling: Secondary | ICD-10-CM | POA: Diagnosis not present

## 2020-05-25 DIAGNOSIS — E7800 Pure hypercholesterolemia, unspecified: Secondary | ICD-10-CM | POA: Diagnosis not present

## 2020-06-19 DIAGNOSIS — Z6834 Body mass index (BMI) 34.0-34.9, adult: Secondary | ICD-10-CM | POA: Diagnosis not present

## 2020-06-19 DIAGNOSIS — R059 Cough, unspecified: Secondary | ICD-10-CM | POA: Diagnosis not present

## 2020-07-12 DIAGNOSIS — S72021A Displaced fracture of epiphysis (separation) (upper) of right femur, initial encounter for closed fracture: Secondary | ICD-10-CM | POA: Diagnosis not present

## 2020-07-12 DIAGNOSIS — Z96641 Presence of right artificial hip joint: Secondary | ICD-10-CM | POA: Diagnosis not present

## 2020-07-14 IMAGING — RF DG HIP (WITH PELVIS) OPERATIVE*R*
1 series · 2 of 2 positions shown · non-contrast
Comparison: None.

CLINICAL DATA: Hip replacement

EXAM:
OPERATIVE RIGHT HIP (WITH PELVIS IF PERFORMED) 1 VIEW
TECHNIQUE: Fluoroscopic spot image(s) were submitted for interpretation
post-operatively.
FLUOROSCOPY TIME:  7 seconds

[Series 1: unknown protocol · 0.20mm/px · 2 of 2 slices shown]
[im 1/2]
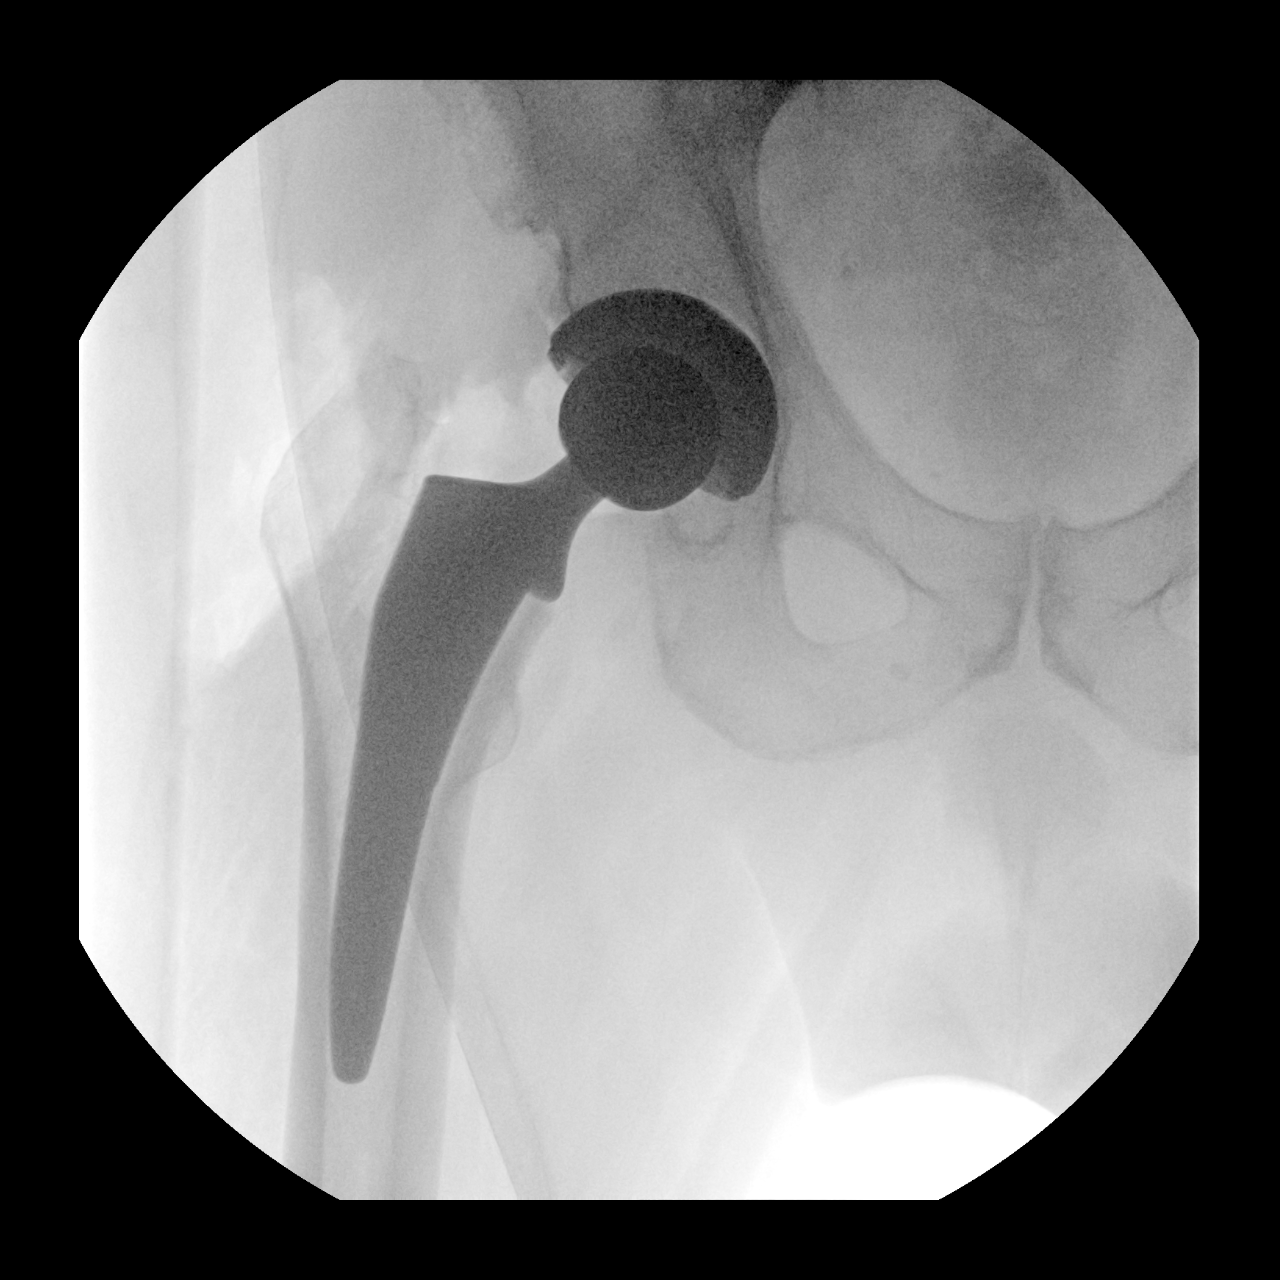
[im 2/2]
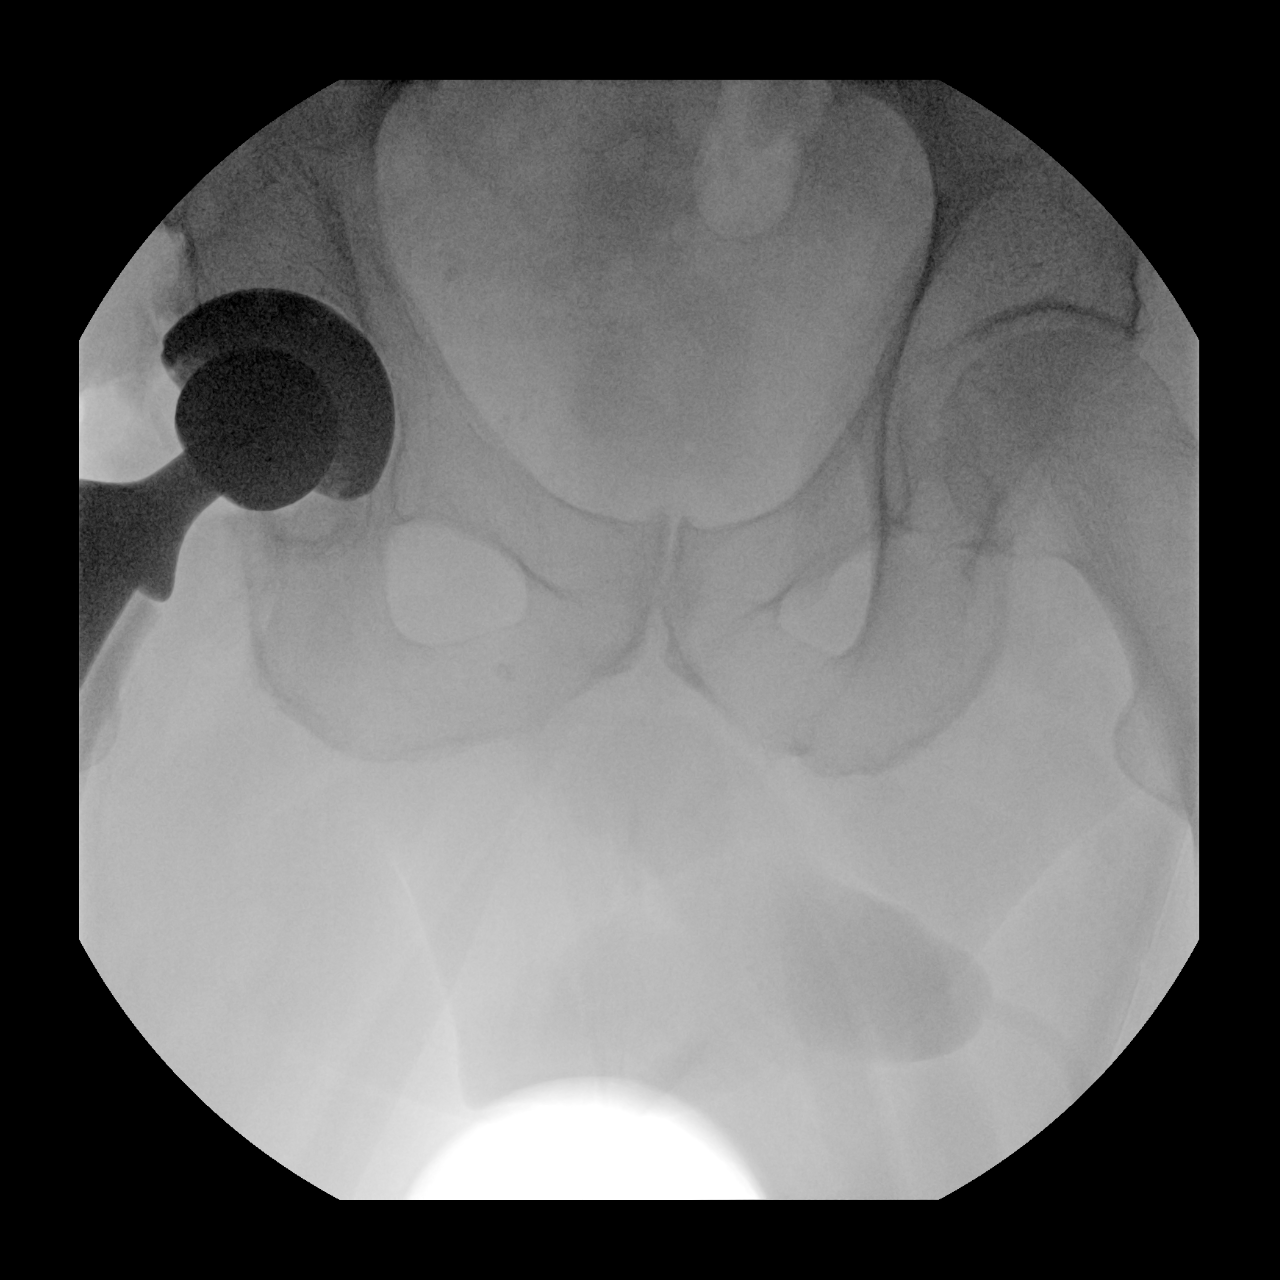

[2 of 2 positions shown; findings below may reference images not displayed]

FINDINGS: Frontal intraoperative radiographs demonstrate right total hip
arthroplasty.
IMPRESSION: Fluoroscopic guidance for right total hip arthroplasty.

## 2020-07-14 IMAGING — DX DG PORTABLE PELVIS
2 series · 2 of 2 positions shown · non-contrast
Comparison: 03/09/2019

CLINICAL DATA: Status post right hip replacement.

EXAM:
PORTABLE PELVIS 1-2 VIEWS

[pelvis ap (1 of 2)]
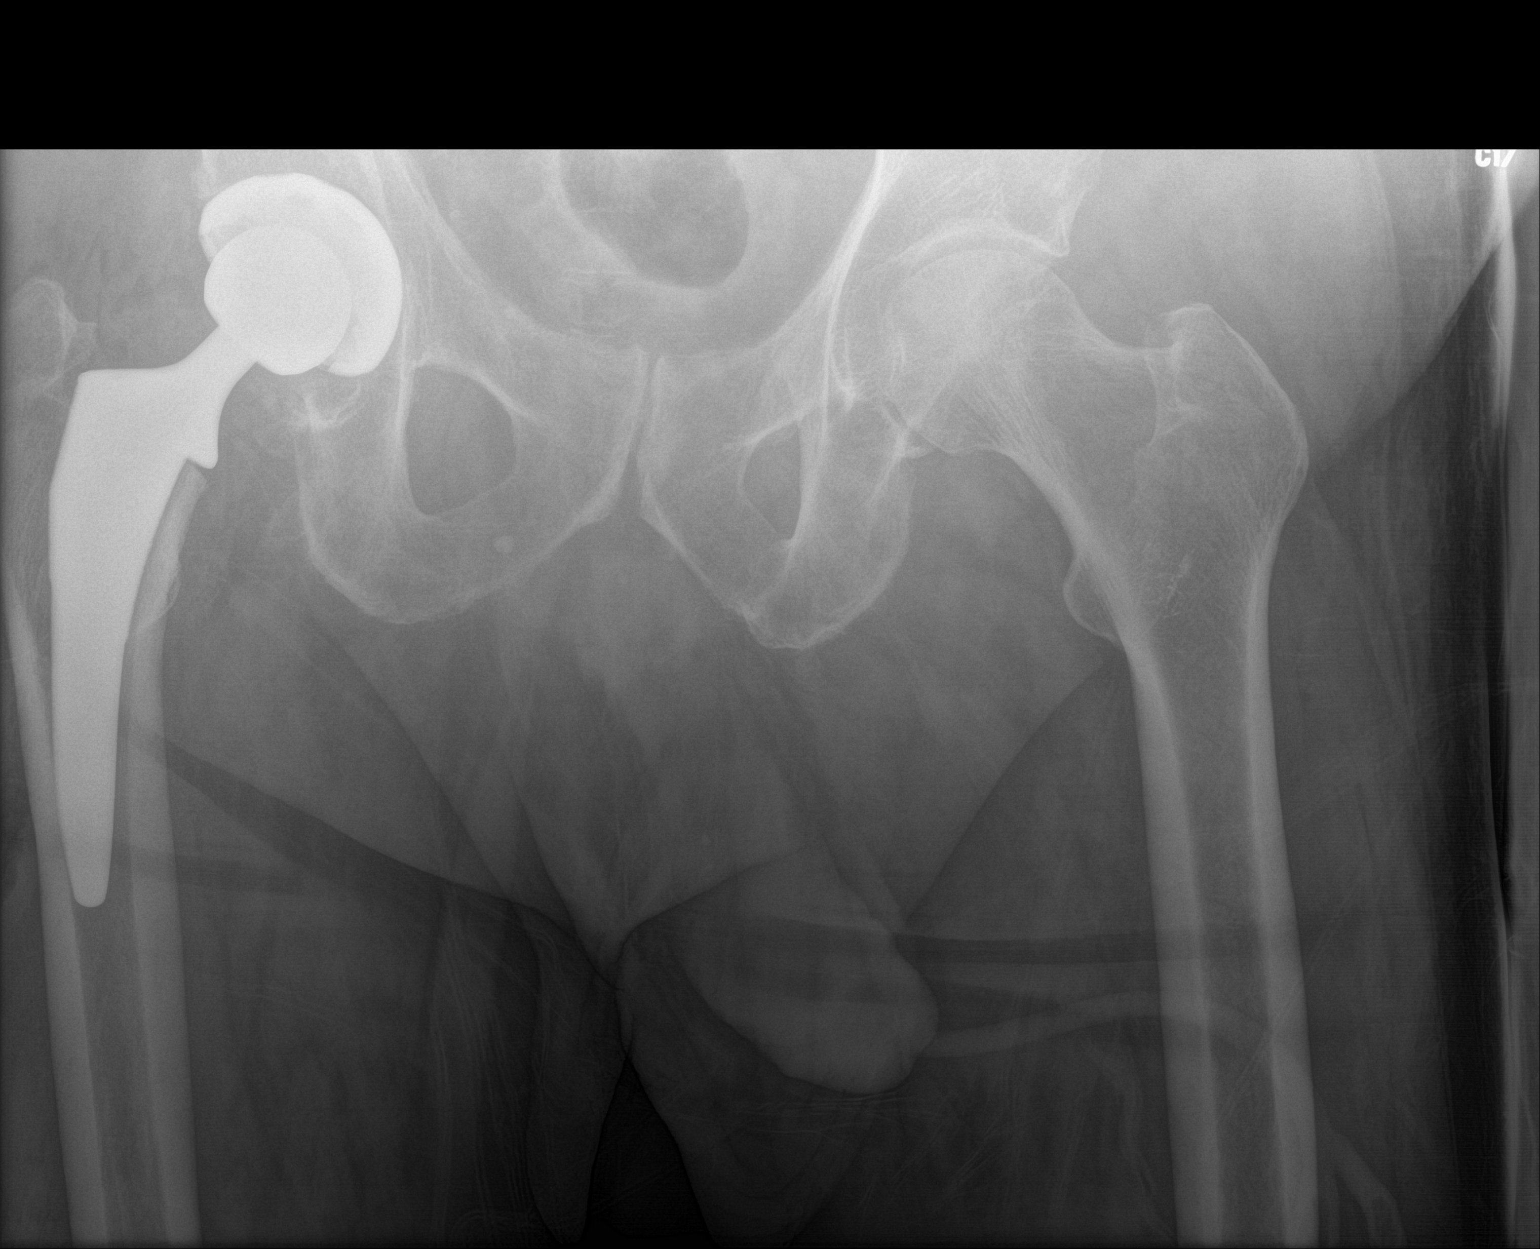

[pelvis ap (2 of 2)]
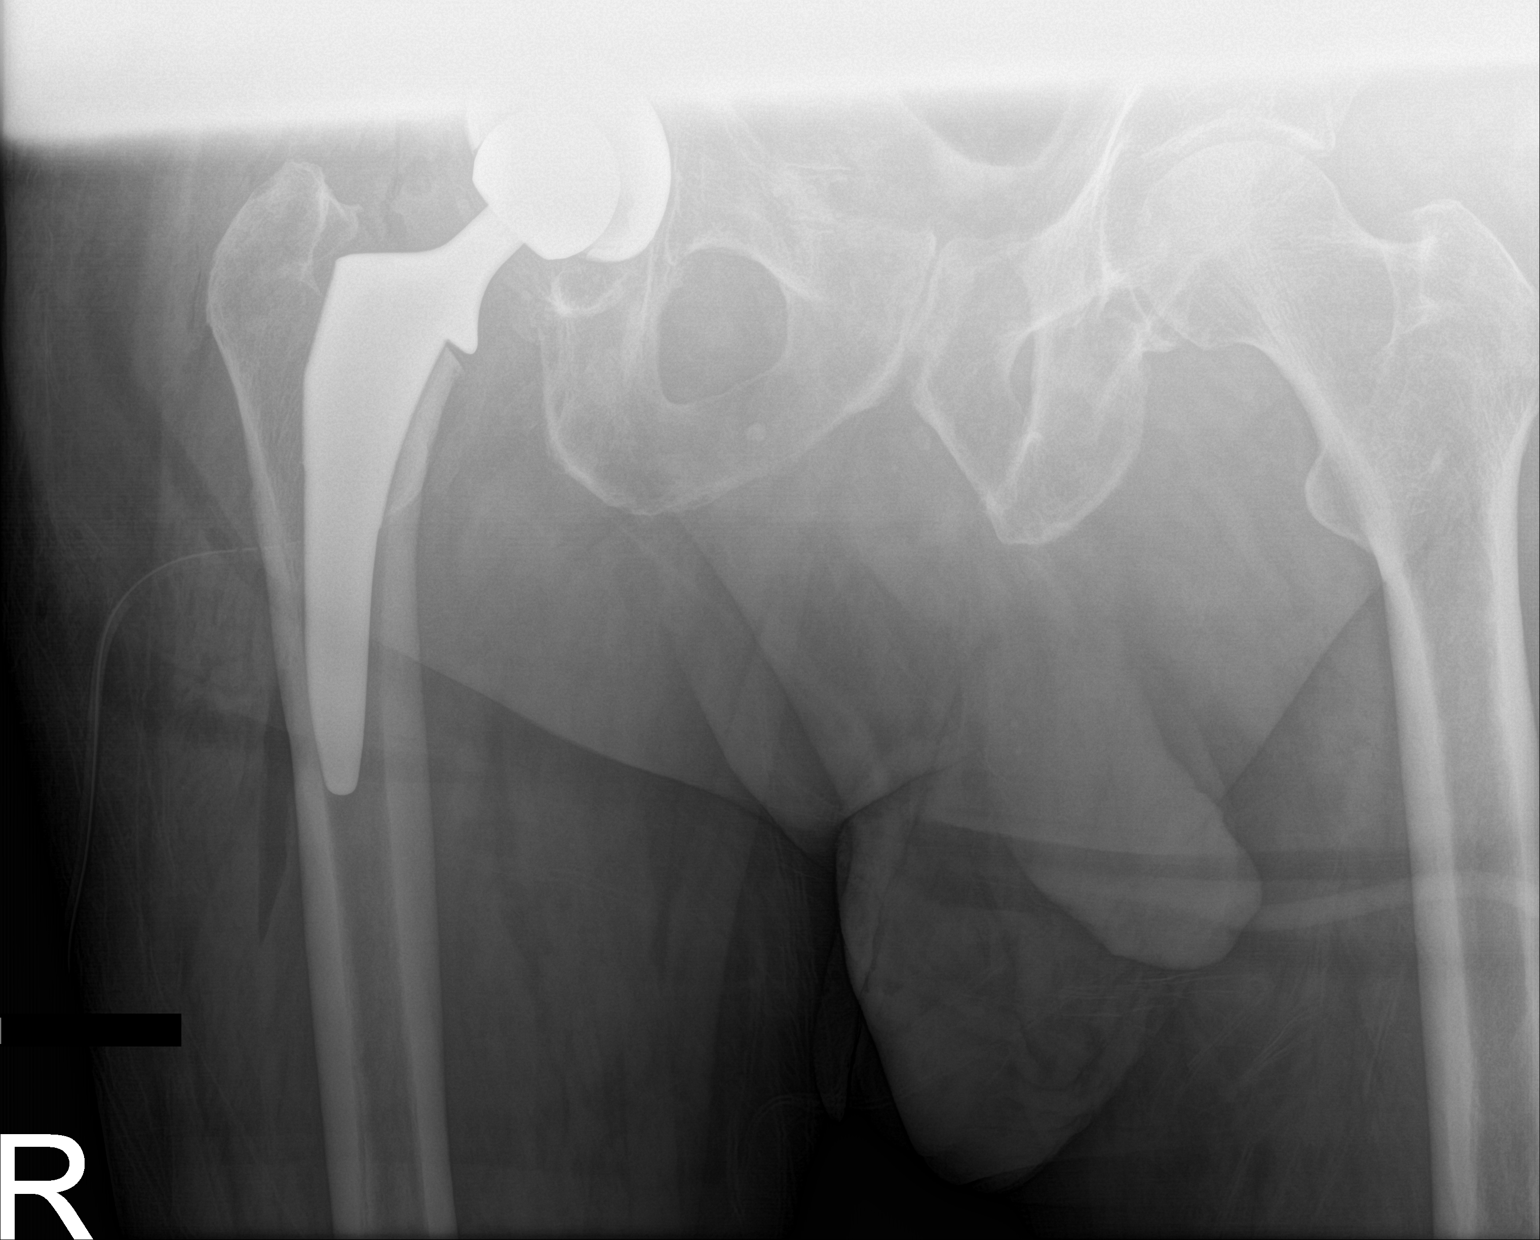

[2 of 2 positions shown; findings below may reference images not displayed]

FINDINGS: AP view of the lower pelvis and proximal thighs shows the patient to
be status post left total hip arthroplasty. No evidence for
immediate hardware complications. Surgical drain overlies the soft
tissues.
IMPRESSION: No evidence for immediate hardware complications status post right
total hip replacement.

## 2020-07-24 DIAGNOSIS — L570 Actinic keratosis: Secondary | ICD-10-CM | POA: Diagnosis not present

## 2020-11-16 DIAGNOSIS — E7800 Pure hypercholesterolemia, unspecified: Secondary | ICD-10-CM | POA: Diagnosis not present

## 2020-11-16 DIAGNOSIS — R7303 Prediabetes: Secondary | ICD-10-CM | POA: Diagnosis not present

## 2020-11-23 DIAGNOSIS — G4700 Insomnia, unspecified: Secondary | ICD-10-CM | POA: Diagnosis not present

## 2020-11-23 DIAGNOSIS — I10 Essential (primary) hypertension: Secondary | ICD-10-CM | POA: Diagnosis not present

## 2020-11-23 DIAGNOSIS — Z23 Encounter for immunization: Secondary | ICD-10-CM | POA: Diagnosis not present

## 2020-11-23 DIAGNOSIS — R7303 Prediabetes: Secondary | ICD-10-CM | POA: Diagnosis not present

## 2020-11-23 DIAGNOSIS — E7800 Pure hypercholesterolemia, unspecified: Secondary | ICD-10-CM | POA: Diagnosis not present

## 2020-12-25 DIAGNOSIS — M1000 Idiopathic gout, unspecified site: Secondary | ICD-10-CM | POA: Diagnosis not present

## 2021-02-05 DIAGNOSIS — L570 Actinic keratosis: Secondary | ICD-10-CM | POA: Diagnosis not present

## 2021-02-05 DIAGNOSIS — C4441 Basal cell carcinoma of skin of scalp and neck: Secondary | ICD-10-CM | POA: Diagnosis not present

## 2021-02-05 DIAGNOSIS — C44519 Basal cell carcinoma of skin of other part of trunk: Secondary | ICD-10-CM | POA: Diagnosis not present

## 2021-02-07 DIAGNOSIS — M25562 Pain in left knee: Secondary | ICD-10-CM | POA: Diagnosis not present

## 2021-03-05 DIAGNOSIS — L299 Pruritus, unspecified: Secondary | ICD-10-CM | POA: Diagnosis not present

## 2021-03-05 DIAGNOSIS — C44529 Squamous cell carcinoma of skin of other part of trunk: Secondary | ICD-10-CM | POA: Diagnosis not present

## 2021-03-05 DIAGNOSIS — L918 Other hypertrophic disorders of the skin: Secondary | ICD-10-CM | POA: Diagnosis not present

## 2021-03-05 DIAGNOSIS — L300 Nummular dermatitis: Secondary | ICD-10-CM | POA: Diagnosis not present

## 2021-05-27 DIAGNOSIS — R7303 Prediabetes: Secondary | ICD-10-CM | POA: Diagnosis not present

## 2021-05-27 DIAGNOSIS — Z125 Encounter for screening for malignant neoplasm of prostate: Secondary | ICD-10-CM | POA: Diagnosis not present

## 2021-05-27 DIAGNOSIS — E7800 Pure hypercholesterolemia, unspecified: Secondary | ICD-10-CM | POA: Diagnosis not present

## 2021-05-31 DIAGNOSIS — I10 Essential (primary) hypertension: Secondary | ICD-10-CM | POA: Diagnosis not present

## 2021-05-31 DIAGNOSIS — R7303 Prediabetes: Secondary | ICD-10-CM | POA: Diagnosis not present

## 2021-05-31 DIAGNOSIS — Z1331 Encounter for screening for depression: Secondary | ICD-10-CM | POA: Diagnosis not present

## 2021-05-31 DIAGNOSIS — E7800 Pure hypercholesterolemia, unspecified: Secondary | ICD-10-CM | POA: Diagnosis not present

## 2021-07-19 DIAGNOSIS — G5621 Lesion of ulnar nerve, right upper limb: Secondary | ICD-10-CM | POA: Diagnosis not present

## 2021-08-02 DIAGNOSIS — G5621 Lesion of ulnar nerve, right upper limb: Secondary | ICD-10-CM | POA: Diagnosis not present

## 2021-08-02 DIAGNOSIS — G5601 Carpal tunnel syndrome, right upper limb: Secondary | ICD-10-CM | POA: Diagnosis not present

## 2021-08-06 DIAGNOSIS — G5601 Carpal tunnel syndrome, right upper limb: Secondary | ICD-10-CM | POA: Diagnosis not present

## 2021-08-06 DIAGNOSIS — G5621 Lesion of ulnar nerve, right upper limb: Secondary | ICD-10-CM | POA: Diagnosis not present

## 2021-08-13 DIAGNOSIS — L570 Actinic keratosis: Secondary | ICD-10-CM | POA: Diagnosis not present

## 2021-08-13 DIAGNOSIS — L578 Other skin changes due to chronic exposure to nonionizing radiation: Secondary | ICD-10-CM | POA: Diagnosis not present

## 2021-08-13 DIAGNOSIS — C44529 Squamous cell carcinoma of skin of other part of trunk: Secondary | ICD-10-CM | POA: Diagnosis not present

## 2021-09-09 DIAGNOSIS — G5621 Lesion of ulnar nerve, right upper limb: Secondary | ICD-10-CM | POA: Diagnosis not present

## 2021-09-09 DIAGNOSIS — G5601 Carpal tunnel syndrome, right upper limb: Secondary | ICD-10-CM | POA: Diagnosis not present

## 2021-09-24 DIAGNOSIS — M25561 Pain in right knee: Secondary | ICD-10-CM | POA: Diagnosis not present

## 2021-09-24 DIAGNOSIS — M25621 Stiffness of right elbow, not elsewhere classified: Secondary | ICD-10-CM | POA: Diagnosis not present

## 2021-09-24 DIAGNOSIS — M25562 Pain in left knee: Secondary | ICD-10-CM | POA: Diagnosis not present

## 2021-10-08 DIAGNOSIS — M25621 Stiffness of right elbow, not elsewhere classified: Secondary | ICD-10-CM | POA: Diagnosis not present

## 2021-10-22 DIAGNOSIS — M25621 Stiffness of right elbow, not elsewhere classified: Secondary | ICD-10-CM | POA: Diagnosis not present

## 2021-11-14 DIAGNOSIS — R051 Acute cough: Secondary | ICD-10-CM | POA: Diagnosis not present

## 2021-11-14 DIAGNOSIS — J209 Acute bronchitis, unspecified: Secondary | ICD-10-CM | POA: Diagnosis not present

## 2021-12-06 DIAGNOSIS — R051 Acute cough: Secondary | ICD-10-CM | POA: Diagnosis not present

## 2021-12-06 DIAGNOSIS — J019 Acute sinusitis, unspecified: Secondary | ICD-10-CM | POA: Diagnosis not present

## 2021-12-17 DIAGNOSIS — L570 Actinic keratosis: Secondary | ICD-10-CM | POA: Diagnosis not present

## 2021-12-17 DIAGNOSIS — Z85820 Personal history of malignant melanoma of skin: Secondary | ICD-10-CM | POA: Diagnosis not present

## 2021-12-17 DIAGNOSIS — L578 Other skin changes due to chronic exposure to nonionizing radiation: Secondary | ICD-10-CM | POA: Diagnosis not present

## 2021-12-23 DIAGNOSIS — R7303 Prediabetes: Secondary | ICD-10-CM | POA: Diagnosis not present

## 2021-12-23 DIAGNOSIS — E7800 Pure hypercholesterolemia, unspecified: Secondary | ICD-10-CM | POA: Diagnosis not present

## 2021-12-26 DIAGNOSIS — R7303 Prediabetes: Secondary | ICD-10-CM | POA: Diagnosis not present

## 2021-12-26 DIAGNOSIS — E669 Obesity, unspecified: Secondary | ICD-10-CM | POA: Diagnosis not present

## 2021-12-26 DIAGNOSIS — I10 Essential (primary) hypertension: Secondary | ICD-10-CM | POA: Diagnosis not present

## 2021-12-26 DIAGNOSIS — E7800 Pure hypercholesterolemia, unspecified: Secondary | ICD-10-CM | POA: Diagnosis not present

## 2022-06-16 DIAGNOSIS — M1712 Unilateral primary osteoarthritis, left knee: Secondary | ICD-10-CM | POA: Diagnosis not present

## 2022-06-19 DIAGNOSIS — Z85820 Personal history of malignant melanoma of skin: Secondary | ICD-10-CM | POA: Diagnosis not present

## 2022-06-19 DIAGNOSIS — L570 Actinic keratosis: Secondary | ICD-10-CM | POA: Diagnosis not present

## 2022-06-19 DIAGNOSIS — L700 Acne vulgaris: Secondary | ICD-10-CM | POA: Diagnosis not present

## 2022-06-23 DIAGNOSIS — M1712 Unilateral primary osteoarthritis, left knee: Secondary | ICD-10-CM | POA: Diagnosis not present

## 2022-06-27 DIAGNOSIS — E7800 Pure hypercholesterolemia, unspecified: Secondary | ICD-10-CM | POA: Diagnosis not present

## 2022-06-27 DIAGNOSIS — Z125 Encounter for screening for malignant neoplasm of prostate: Secondary | ICD-10-CM | POA: Diagnosis not present

## 2022-06-27 DIAGNOSIS — R7303 Prediabetes: Secondary | ICD-10-CM | POA: Diagnosis not present

## 2022-06-30 DIAGNOSIS — M1712 Unilateral primary osteoarthritis, left knee: Secondary | ICD-10-CM | POA: Diagnosis not present

## 2022-07-02 DIAGNOSIS — E669 Obesity, unspecified: Secondary | ICD-10-CM | POA: Diagnosis not present

## 2022-07-02 DIAGNOSIS — I10 Essential (primary) hypertension: Secondary | ICD-10-CM | POA: Diagnosis not present

## 2022-07-02 DIAGNOSIS — R7303 Prediabetes: Secondary | ICD-10-CM | POA: Diagnosis not present

## 2022-07-02 DIAGNOSIS — E7800 Pure hypercholesterolemia, unspecified: Secondary | ICD-10-CM | POA: Diagnosis not present

## 2022-12-15 DIAGNOSIS — L821 Other seborrheic keratosis: Secondary | ICD-10-CM | POA: Diagnosis not present

## 2022-12-15 DIAGNOSIS — L570 Actinic keratosis: Secondary | ICD-10-CM | POA: Diagnosis not present

## 2022-12-15 DIAGNOSIS — L578 Other skin changes due to chronic exposure to nonionizing radiation: Secondary | ICD-10-CM | POA: Diagnosis not present

## 2022-12-15 DIAGNOSIS — L820 Inflamed seborrheic keratosis: Secondary | ICD-10-CM | POA: Diagnosis not present

## 2022-12-29 DIAGNOSIS — E7800 Pure hypercholesterolemia, unspecified: Secondary | ICD-10-CM | POA: Diagnosis not present

## 2022-12-29 DIAGNOSIS — R7303 Prediabetes: Secondary | ICD-10-CM | POA: Diagnosis not present

## 2022-12-31 DIAGNOSIS — Z79899 Other long term (current) drug therapy: Secondary | ICD-10-CM | POA: Diagnosis not present

## 2022-12-31 DIAGNOSIS — R7303 Prediabetes: Secondary | ICD-10-CM | POA: Diagnosis not present

## 2022-12-31 DIAGNOSIS — E7800 Pure hypercholesterolemia, unspecified: Secondary | ICD-10-CM | POA: Diagnosis not present

## 2022-12-31 DIAGNOSIS — E669 Obesity, unspecified: Secondary | ICD-10-CM | POA: Diagnosis not present

## 2022-12-31 DIAGNOSIS — I10 Essential (primary) hypertension: Secondary | ICD-10-CM | POA: Diagnosis not present

## 2023-04-13 DIAGNOSIS — M1712 Unilateral primary osteoarthritis, left knee: Secondary | ICD-10-CM | POA: Diagnosis not present

## 2023-04-20 DIAGNOSIS — M1712 Unilateral primary osteoarthritis, left knee: Secondary | ICD-10-CM | POA: Diagnosis not present

## 2023-04-28 DIAGNOSIS — M65332 Trigger finger, left middle finger: Secondary | ICD-10-CM | POA: Diagnosis not present

## 2023-04-28 DIAGNOSIS — M1712 Unilateral primary osteoarthritis, left knee: Secondary | ICD-10-CM | POA: Diagnosis not present

## 2023-06-23 DIAGNOSIS — L821 Other seborrheic keratosis: Secondary | ICD-10-CM | POA: Diagnosis not present

## 2023-06-23 DIAGNOSIS — L570 Actinic keratosis: Secondary | ICD-10-CM | POA: Diagnosis not present

## 2023-06-23 DIAGNOSIS — Z85820 Personal history of malignant melanoma of skin: Secondary | ICD-10-CM | POA: Diagnosis not present

## 2023-07-06 DIAGNOSIS — E7800 Pure hypercholesterolemia, unspecified: Secondary | ICD-10-CM | POA: Diagnosis not present

## 2023-07-06 DIAGNOSIS — Z125 Encounter for screening for malignant neoplasm of prostate: Secondary | ICD-10-CM | POA: Diagnosis not present

## 2023-07-06 DIAGNOSIS — R7303 Prediabetes: Secondary | ICD-10-CM | POA: Diagnosis not present

## 2023-07-08 DIAGNOSIS — R7303 Prediabetes: Secondary | ICD-10-CM | POA: Diagnosis not present

## 2023-07-08 DIAGNOSIS — E7800 Pure hypercholesterolemia, unspecified: Secondary | ICD-10-CM | POA: Diagnosis not present

## 2023-07-08 DIAGNOSIS — Z79899 Other long term (current) drug therapy: Secondary | ICD-10-CM | POA: Diagnosis not present

## 2023-07-08 DIAGNOSIS — Z1331 Encounter for screening for depression: Secondary | ICD-10-CM | POA: Diagnosis not present

## 2023-07-08 DIAGNOSIS — I10 Essential (primary) hypertension: Secondary | ICD-10-CM | POA: Diagnosis not present

## 2023-12-29 DIAGNOSIS — L821 Other seborrheic keratosis: Secondary | ICD-10-CM | POA: Diagnosis not present

## 2023-12-29 DIAGNOSIS — Z85820 Personal history of malignant melanoma of skin: Secondary | ICD-10-CM | POA: Diagnosis not present

## 2023-12-29 DIAGNOSIS — L570 Actinic keratosis: Secondary | ICD-10-CM | POA: Diagnosis not present

## 2023-12-29 DIAGNOSIS — L578 Other skin changes due to chronic exposure to nonionizing radiation: Secondary | ICD-10-CM | POA: Diagnosis not present

## 2024-01-05 DIAGNOSIS — M1712 Unilateral primary osteoarthritis, left knee: Secondary | ICD-10-CM | POA: Diagnosis not present

## 2024-01-06 DIAGNOSIS — E7800 Pure hypercholesterolemia, unspecified: Secondary | ICD-10-CM | POA: Diagnosis not present

## 2024-01-06 DIAGNOSIS — R7303 Prediabetes: Secondary | ICD-10-CM | POA: Diagnosis not present

## 2024-01-08 DIAGNOSIS — R7303 Prediabetes: Secondary | ICD-10-CM | POA: Diagnosis not present

## 2024-01-08 DIAGNOSIS — Z23 Encounter for immunization: Secondary | ICD-10-CM | POA: Diagnosis not present

## 2024-01-08 DIAGNOSIS — I10 Essential (primary) hypertension: Secondary | ICD-10-CM | POA: Diagnosis not present

## 2024-01-08 DIAGNOSIS — Z79899 Other long term (current) drug therapy: Secondary | ICD-10-CM | POA: Diagnosis not present

## 2024-01-08 DIAGNOSIS — E7800 Pure hypercholesterolemia, unspecified: Secondary | ICD-10-CM | POA: Diagnosis not present

## 2024-01-08 DIAGNOSIS — K219 Gastro-esophageal reflux disease without esophagitis: Secondary | ICD-10-CM | POA: Diagnosis not present
